# Patient Record
Sex: Male | Born: 1970 | Race: White | Hispanic: No | Marital: Married | Smoking: Never smoker
Health system: Southern US, Community
[De-identification: ages and names within clinical notes are randomized; demographics above are authoritative.]

## PROBLEM LIST (undated history)

## (undated) DIAGNOSIS — Z8719 Personal history of other diseases of the digestive system: Secondary | ICD-10-CM

## (undated) DIAGNOSIS — E291 Testicular hypofunction: Secondary | ICD-10-CM

## (undated) DIAGNOSIS — K219 Gastro-esophageal reflux disease without esophagitis: Secondary | ICD-10-CM

## (undated) DIAGNOSIS — I10 Essential (primary) hypertension: Secondary | ICD-10-CM

## (undated) DIAGNOSIS — E7800 Pure hypercholesterolemia, unspecified: Secondary | ICD-10-CM

## (undated) DIAGNOSIS — F419 Anxiety disorder, unspecified: Secondary | ICD-10-CM

## (undated) DIAGNOSIS — E785 Hyperlipidemia, unspecified: Secondary | ICD-10-CM

## (undated) DIAGNOSIS — J45909 Unspecified asthma, uncomplicated: Secondary | ICD-10-CM

## (undated) HISTORY — DX: Hyperlipidemia, unspecified: E78.5

## (undated) HISTORY — PX: APPENDECTOMY: SHX54

## (undated) HISTORY — DX: Anxiety disorder, unspecified: F41.9

## (undated) HISTORY — DX: Gastro-esophageal reflux disease without esophagitis: K21.9

---

## 1997-06-02 HISTORY — PX: LASIK: SHX215

## 2007-12-08 ENCOUNTER — Inpatient Hospital Stay (HOSPITAL_COMMUNITY): Admission: EM | Admit: 2007-12-08 | Discharge: 2007-12-09 | Payer: Self-pay | Admitting: Emergency Medicine

## 2009-09-04 ENCOUNTER — Ambulatory Visit: Payer: Self-pay | Admitting: Diagnostic Radiology

## 2009-09-04 ENCOUNTER — Emergency Department (HOSPITAL_BASED_OUTPATIENT_CLINIC_OR_DEPARTMENT_OTHER): Admission: EM | Admit: 2009-09-04 | Discharge: 2009-09-04 | Payer: Self-pay | Admitting: Emergency Medicine

## 2010-10-15 NOTE — Discharge Summary (Signed)
NAMEAIVAN, Joshua Phelps              ACCOUNT NO.:  [id number]   MEDICAL RECORD NO.:  [id number]          PATIENT TYPE:  INP   LOCATION:  2020                         FACILITY:  [facility]   PHYSICIAN:  [name], MD      DATE OF BIRTH:  [birth year]   DATE OF ADMISSION:  12/08/2007  DATE OF DISCHARGE:  12/09/2007                               DISCHARGE SUMMARY   DISCHARGE DIAGNOSES:  1. Chest pain, resolved.  2. Abnormal electrocardiogram.  3. Hypertension, treated.  4. Dyslipidemia.   HOSPITAL COURSE:  Joshua Phelps is an 40 year old male patient with no  known history of coronary artery disease who developed spontaneous  substernal chest pain, jaw pain, and headache lasting about 20 minutes  on Sunday.  The pain went away on it is own.  On the day of admission,  he had another episode of chest pain, jaw pain, and headache lasting  again about 20 minutes.  This occurred in a nonexertional setting.  He  went to see Dr. Abigail Miyamoto and he was given 4 baby aspirin, and his chest  pain resolved, but in the emergency room he felt worn out.  He did  have some nausea.   His EKG showed continual J-point elevation in V2 with nonspecific ST-T  wave changes in V3, V6.  There was significant inferior reciprocal  changes.   Total cholesterol 261, LDL 185, HDL 36, triglycerides 202, BUN 15,  creatinine 1.32, hemoglobin 16, hematocrit 47, cardiac isoenzymes  negative.   Because of his symptoms and because of his abnormal EKG, we feel that it  is important to go ahead and have a stress Cardiolite tomorrow in the  office.  This has been arranged.  He is to show up in the office at 10  a.m. for the test.  Otherwise, he is to remain on  low-sodium, heart-  healthy diet.  NPO after midnight.  Increase activity slowly.   DISCHARGE MEDICATIONS:  1. Plavix 75 mg a day.  2. Baby aspirin 81 a day.  3. Lisinopril 5 mg a day for hypertension.  4. Sublingual nitroglycerin p.r.n. chest pain.   Of note, his  cholesterol is elevated, and he has elected to try to  control this with diet, but may ultimately need to be on statin therapy.      Guy Franco, P.A.      [name], MD  Electronically Signed    LB/MEDQ  D:  12/09/2007  T:  12/10/2007  Job:  [id number]   cc:   [name], MD  Pam Drown, M.D.

## 2010-10-15 NOTE — H&P (Signed)
Joshua Phelps, Joshua Phelps              ACCOUNT NO.:  [id number]   MEDICAL RECORD NO.:  [id number]          PATIENT TYPE:  EMS   LOCATION:  [facility]                         FACILITY:  [facility]   PHYSICIAN:  [name], MD      DATE OF BIRTH:  09/29/1970   DATE OF ADMISSION:  12/08/2007  DATE OF DISCHARGE:                              HISTORY & PHYSICAL   CHIEF COMPLAINT:  Chest pain.   HISTORY:  Brendan Gadson is a 40 year old male patient with no known  history of coronary artery disease who developed spontaneous substernal  chest pain, jaw pain, and headache lasting about 20 minutes from Sunday.  The pain went away on its own.  Again, today while at work (  nonexertional), the patient began having chest pain, jaw pain, and then  a slight headache lasting about 20 minutes.  He then went to see Dr.  Abigail Miyamoto.  He was given 4 baby aspirin and the chest pain resolved, but  now he feels worn out.  He does have some nausea.  He was transported  by EMS to the [facility].   ALLERGIES:  No known drug allergies.   MEDICATIONS:  1. AndroGel.  2. Wellbutrin 300 mg a day.  3. Lisinopril 5 mg a day.  4. Pepcid over-the-counter.   SOCIAL HISTORY:  Drinks about 3 beers per day.  Over the weekend, he  says he drank a lot of beer, no tobacco or illicit drug use.   FAMILY HISTORY:  Dad had a myocardial infarction in his 51s.   PAST MEDICAL HISTORY:  1. Hypertension.  2. Anxiety.  3. GERD status post appendectomy.   ROS: unless specified above, all other ROS negative, no syncope, no  bleeding, no fevers.   PHYSICAL EXAMINATION:  VITAL SIGNS:  Blood pressure 150/90, pulse 72,  and respirations 16.  GEN: AAO x 3 in NAD  HEENT:  Grossly normal.  No carotid or subclavian bruits.  No JVD or  thyromegaly.  Sclerae clear.  Conjunctivae normal.  Nares without  drainage.  CHEST:  Clear to auscultation bilaterally.  No wheezing or rhonchi.  HEART:  Regular rate and rhythm.  No evidence of  murmur.  ABDOMEN:  Soft, nontender, and nondistended.  No masses.  No bruits.  EXTREMITIES:  No peripheral edema.  No femoral bruits.  NEURO:  Alert and oriented.  He was somewhat anxious appearing,  otherwise his cranial nerves are intact.  SKIN:  Warm and dry.   LABORATORY DATA:  Lab studies are currently pending that include CMET,  CBC, PT/INR, CK-MB, and troponin.  Chest x-ray has been done and final  results are pending.  His EKG shows normal sinus rhythm with J-point  elevation in leads V2 and V3 with T-wave inversion laterally.   ASSESSMENT AND PLAN:  1. Chest pain, atypical.  2. Family history of coronary artery disease.  3. Abnormal EKG.  4. Anxiety.  5. Hypertension.   The patient was currently asymptomatic.  We did note J-point elevation  in V2.  There were no reciprocal changes in the inferior leads.  At  this  point, we will treat the patient aggressively utilizing Lovenox,  nitrates, aspirin, and Plavix.  We will keep him n.p.o. after midnight  in the event that his biomarkers are positive, so that we can proceed  with cardiac catheterization.  Otherwise, if he remains pain-free and  his biomarkers markers are negative, we will tentatively schedule him  for a Cardiolite.  The patient has been seen and examined by Dr. Donato Schultz.      Guy Franco, P.A.      [name], MD  Electronically Signed    LB/MEDQ  D:  12/08/2007  T:  12/09/2007  Job:  [id number]   cc:   Pam Drown, M.D.  [name], MD

## 2011-02-27 LAB — COMPREHENSIVE METABOLIC PANEL
AST: 22
Alkaline Phosphatase: 61
CO2: 30
Calcium: 9.5
GFR calc Af Amer: >60
GFR calc non Af Amer: >60
Glucose, Bld: 129 — ABNORMAL HIGH
Total Bilirubin: 1.2
Total Protein: 7.4

## 2011-02-27 LAB — CARDIAC PANEL(CRET KIN+CKTOT+MB+TROPI)
CK, MB: 2.3
CK, MB: 3.7
Relative Index: 1.5
Total CK: 202
Troponin I: 0.01

## 2011-02-27 LAB — CK TOTAL AND CKMB (NOT AT ARMC)
CK, MB: 2.6
Relative Index: 1.7
Total CK: 153

## 2011-02-27 LAB — LIPID PANEL
LDL Cholesterol: 185 — ABNORMAL HIGH
Triglycerides: 202 — ABNORMAL HIGH

## 2011-02-27 LAB — CBC
Hemoglobin: 17.1 — ABNORMAL HIGH
Platelets: 242
Platelets: 244
RBC: 5.34
RDW: 12.6

## 2011-02-27 LAB — PROTIME-INR: Prothrombin Time: 13

## 2011-02-27 LAB — TSH: TSH: 2.183 (ref 0.350–4.500)

## 2015-12-03 ENCOUNTER — Emergency Department (HOSPITAL_COMMUNITY): Payer: Commercial Managed Care - PPO

## 2015-12-03 ENCOUNTER — Emergency Department (HOSPITAL_COMMUNITY)
Admission: EM | Admit: 2015-12-03 | Discharge: 2015-12-03 | Disposition: A | Discharge status: Home / Self Care | Payer: Commercial Managed Care - PPO | Attending: Emergency Medicine | Admitting: Emergency Medicine

## 2015-12-03 ENCOUNTER — Encounter (HOSPITAL_COMMUNITY): Payer: Self-pay | Admitting: Emergency Medicine

## 2015-12-03 ENCOUNTER — Ambulatory Visit (HOSPITAL_COMMUNITY)
Admission: EM | Admit: 2015-12-03 | Discharge: 2015-12-03 | Disposition: A | Discharge status: Nursing Home (Includes ICF) | Payer: Commercial Managed Care - PPO | Source: Home / Self Care | Attending: Family Medicine | Admitting: Family Medicine

## 2015-12-03 DIAGNOSIS — Z7982 Long term (current) use of aspirin: Secondary | ICD-10-CM | POA: Insufficient documentation

## 2015-12-03 DIAGNOSIS — F419 Anxiety disorder, unspecified: Secondary | ICD-10-CM | POA: Diagnosis not present

## 2015-12-03 DIAGNOSIS — R110 Nausea: Secondary | ICD-10-CM

## 2015-12-03 DIAGNOSIS — R002 Palpitations: Secondary | ICD-10-CM | POA: Diagnosis not present

## 2015-12-03 DIAGNOSIS — Z79899 Other long term (current) drug therapy: Secondary | ICD-10-CM | POA: Diagnosis not present

## 2015-12-03 DIAGNOSIS — R9431 Abnormal electrocardiogram [ECG] [EKG]: Secondary | ICD-10-CM | POA: Diagnosis not present

## 2015-12-03 DIAGNOSIS — I10 Essential (primary) hypertension: Secondary | ICD-10-CM | POA: Diagnosis not present

## 2015-12-03 DIAGNOSIS — F418 Other specified anxiety disorders: Secondary | ICD-10-CM | POA: Diagnosis not present

## 2015-12-03 DIAGNOSIS — R0789 Other chest pain: Secondary | ICD-10-CM | POA: Diagnosis not present

## 2015-12-03 DIAGNOSIS — J45909 Unspecified asthma, uncomplicated: Secondary | ICD-10-CM | POA: Insufficient documentation

## 2015-12-03 HISTORY — DX: Testicular hypofunction: E29.1

## 2015-12-03 HISTORY — DX: Essential (primary) hypertension: I10

## 2015-12-03 HISTORY — DX: Unspecified asthma, uncomplicated: J45.909

## 2015-12-03 HISTORY — DX: Pure hypercholesterolemia, unspecified: E78.00

## 2015-12-03 LAB — CBC WITH DIFFERENTIAL/PLATELET
BASOS ABS: 0 10*3/uL (ref 0.0–0.1)
BASOS PCT: 0 %
EOS ABS: 0 10*3/uL (ref 0.0–0.7)
Eosinophils Relative: 0 %
HCT: 47.4 % (ref 39.0–52.0)
Hemoglobin: 15.9 g/dL (ref 13.0–17.0)
Lymphocytes Relative: 13 %
Lymphs Abs: 0.9 10*3/uL (ref 0.7–4.0)
MCH: 27.4 pg (ref 26.0–34.0)
MCHC: 33.5 g/dL (ref 30.0–36.0)
MCV: 81.7 fL (ref 78.0–100.0)
MONO ABS: 0.5 10*3/uL (ref 0.1–1.0)
MONOS PCT: 6 %
NEUTROS ABS: 5.9 10*3/uL (ref 1.7–7.7)
Neutrophils Relative %: 81 %
Platelets: 217 10*3/uL (ref 150–400)
RBC: 5.8 MIL/uL (ref 4.22–5.81)
RDW: 14.3 % (ref 11.5–15.5)
WBC: 7.3 10*3/uL (ref 4.0–10.5)

## 2015-12-03 LAB — I-STAT TROPONIN, ED: TROPONIN I, POC: 0.01 ng/mL (ref 0.00–0.08)

## 2015-12-03 LAB — I-STAT CHEM 8, ED
BUN: 13 mg/dL (ref 6–20)
CALCIUM ION: 1.09 mmol/L — AB (ref 1.13–1.30)
CREATININE: 1.2 mg/dL (ref 0.61–1.24)
Chloride: 99 mmol/L — ABNORMAL LOW (ref 101–111)
Glucose, Bld: 102 mg/dL — ABNORMAL HIGH (ref 65–99)
HEMATOCRIT: 49 % (ref 39.0–52.0)
HEMOGLOBIN: 16.7 g/dL (ref 13.0–17.0)
Potassium: 4.1 mmol/L (ref 3.5–5.1)
SODIUM: 138 mmol/L (ref 135–145)
TCO2: 26 mmol/L (ref 0–100)

## 2015-12-03 MED ORDER — ASPIRIN 81 MG PO CHEW
CHEWABLE_TABLET | ORAL | Status: AC
  Filled 2015-12-03: qty 4

## 2015-12-03 MED ORDER — ONDANSETRON 4 MG PO TBDP
ORAL_TABLET | ORAL | Status: AC
  Filled 2015-12-03: qty 1

## 2015-12-03 MED ORDER — ONDANSETRON 4 MG PO TBDP
4.0000 mg | ORAL_TABLET | Freq: Once | ORAL | Status: AC
  Administered 2015-12-03: 4 mg via ORAL

## 2015-12-03 MED ORDER — SODIUM CHLORIDE 0.9 % IV SOLN
Freq: Once | INTRAVENOUS | Status: AC
  Administered 2015-12-03: 13:00:00 via INTRAVENOUS

## 2015-12-03 MED ORDER — LORAZEPAM 2 MG/ML IJ SOLN
INTRAMUSCULAR | Status: AC
  Filled 2015-12-03: qty 1

## 2015-12-03 MED ORDER — ASPIRIN 81 MG PO CHEW
324.0000 mg | CHEWABLE_TABLET | Freq: Once | ORAL | Status: AC
  Administered 2015-12-03: 324 mg via ORAL

## 2015-12-03 MED ORDER — NITROGLYCERIN 0.4 MG SL SUBL
SUBLINGUAL_TABLET | SUBLINGUAL | Status: AC
  Filled 2015-12-03: qty 1

## 2015-12-03 MED ORDER — NITROGLYCERIN 0.4 MG SL SUBL
0.4000 mg | SUBLINGUAL_TABLET | SUBLINGUAL | Status: DC | PRN
  Administered 2015-12-03: 0.4 mg via SUBLINGUAL

## 2015-12-03 MED ORDER — LORAZEPAM 2 MG/ML IJ SOLN
1.0000 mg | Freq: Once | INTRAMUSCULAR | Status: AC
  Administered 2015-12-03: 1 mg via INTRAVENOUS

## 2015-12-03 NOTE — ED Notes (Signed)
Notified carelink 

## 2015-12-03 NOTE — Discharge Instructions (Signed)

## 2015-12-03 NOTE — ED Provider Notes (Signed)
CSN: [id number]     Arrival date & time 12/03/15  1030 History   First MD Initiated Contact with Patient 12/03/15 1137     No chief complaint on file.  (Consider location/radiation/quality/duration/timing/severity/associated sxs/prior Treatment) HPI Comments: 45 year old male presents to the urgent care after 3 days of partying, consuming more alcohol than usual, playing ice hockey and increasing his physical activity. He states he is generally deconditioned and sedentary. Last evening he was playing hockey and he felt palpitations or heart heartbeats in the middle of his chest. Shortly after he had to sit down and felt weak and just not right and needed to leave the game. He never actually had chest pain according to him. When he was removing his hockey close and having to leave the game he states he got really depressed and anxious. Today he is complaining of mild tightness across the anterior chest and minor nausea. His also complaining of an increase level of anxiety and sadness. Denies vomiting. Denies diaphoresis.  Past medical history includes dyslipidemia, hypertension, obesity, sedentary lifestyle, asthma and positive family history for early heart disease.   Past Medical History  Diagnosis Date  . Hypertension   . High cholesterol   . Asthma   . Hypogonadism male    History reviewed. No pertinent past surgical history. Family History  Problem Relation Age of Onset  . Cancer Mother   . Cancer Father   . Heart attack Father    Social History  Substance Use Topics  . Smoking status: Never Smoker   . Smokeless tobacco: None  . Alcohol Use: Yes    Review of Systems  Constitutional: Positive for activity change and fatigue. Negative for fever.  HENT: Negative.   Respiratory: Positive for chest tightness. Negative for cough and shortness of breath.   Cardiovascular: Positive for chest pain and palpitations. Negative for leg swelling.  Gastrointestinal: Positive for nausea.  Negative for vomiting and abdominal pain.  Genitourinary: Negative.   Musculoskeletal: Negative.   Skin: Negative for rash.  Neurological: Negative.  Negative for seizures, syncope, facial asymmetry and numbness.  Psychiatric/Behavioral: The patient is nervous/anxious.   All other systems reviewed and are negative.   Allergies  Review of patient's allergies indicates no known allergies.  Home Medications   Prior to Admission medications   Medication Sig Start Date End Date Taking? Authorizing Provider  LISINOPRIL-HYDROCHLOROTHIAZIDE PO Take by mouth.   Yes Historical Provider, MD  OMEPRAZOLE PO Take by mouth.   Yes Historical Provider, MD  SIMVASTATIN PO Take by mouth.   Yes Historical Provider, MD  Testosterone 75 MG PLLT by Implant route.   Yes Historical Provider, MD   Meds Ordered and Administered this Visit   Medications  0.9 %  sodium chloride infusion (not administered)  nitroGLYCERIN (NITROSTAT) SL tablet 0.4 mg (not administered)  aspirin chewable tablet 324 mg (not administered)  LORazepam (ATIVAN) injection 1 mg (not administered)    BP 132/84 mmHg  Pulse 83  Temp(Src) 97.7 F (36.5 C) (Oral)  Resp 16  SpO2 96% No data found.   Physical Exam  Constitutional: He is oriented to person, place, and time. He appears well-developed and well-nourished. No distress.  Eyes: EOM are normal.  Neck: Normal range of motion. Neck supple.  Cardiovascular: Normal rate, regular rhythm, normal heart sounds and intact distal pulses.   Pulmonary/Chest: Effort normal and breath sounds normal. No respiratory distress. He has no wheezes. He has no rales.  Abdominal: Soft. There is no tenderness.  Musculoskeletal: He exhibits no edema.  Neurological: He is alert and oriented to person, place, and time. No cranial nerve deficit. He exhibits normal muscle tone. Coordination normal.  Skin: Skin is warm and dry.  Psychiatric: His speech is normal. Judgment and thought content normal.  His mood appears anxious. He is not aggressive. Cognition and memory are normal. He exhibits a depressed mood.  Nursing note and vitals reviewed.   ED Course  Procedures (including critical care time)  Labs Review Labs Reviewed - No data to display  Imaging Review No results found.  ED ECG REPORT   Date: 12/03/2015  Rate: 78  Rhythm: normal sinus rhythm  QRS Axis: left  Intervals: normal  ST/T Wave abnormalities: T wave inversions in lead 1, aVL, V4, V5 and V6.  Conduction Disutrbances:none  Narrative Interpretation:   Old EKG Reviewed: none available  I have personally reviewed the EKG tracing and agree with the computerized printout as noted.  Visual Acuity Review  Right Eye Distance:   Left Eye Distance:   Bilateral Distance:    Right Eye Near:   Left Eye Near:    Bilateral Near:         MDM   1. Other chest pain   2. Abnormal EKG   3. Nausea   4. Anxiety about health    Transfer to [place] via care Link for chest pain and abnormal EKG. Meds ordered this encounter  Medications  . LISINOPRIL-HYDROCHLOROTHIAZIDE PO    Sig: Take by mouth.  . Testosterone 75 MG PLLT    Sig: by Implant route.  Marland Kitchen. SIMVASTATIN PO    Sig: Take by mouth.  . OMEPRAZOLE PO    Sig: Take by mouth.  Marland Kitchen. 0.9 %  sodium chloride infusion    Sig:   . nitroGLYCERIN (NITROSTAT) SL tablet 0.4 mg    Sig:   . aspirin chewable tablet 324 mg    Sig:   . LORazepam (ATIVAN) injection 1 mg    Sig:    IV NS, EKG monitor, ASA, NTG, Ativan 1 mg IV, Zofran 4 mg po.     Hayden Rasmussenavid Hyland Mollenkopf, NP 12/03/15 1321

## 2015-12-03 NOTE — ED Notes (Signed)
Iv site per patient request

## 2015-12-03 NOTE — ED Notes (Signed)
PT st's he feels much better at this time.  Pt ambulatory to bathroom without any problems

## 2015-12-03 NOTE — ED Notes (Signed)
Playing ice hockey last night.  Felt like throbbing in chest, just not feeling well.  Went home, tried to hydrate self with liquids.  Patient overall felt very anxious.  Hard time going to sleep and hard time staying asleep.  Felt like arm numb intermittently.  Today feels very emotional, very anxious about what he is feeling.  Also reports feeling very sad.  This overwhelming feeling of sadness and fear.  Also has felt nausea.  Reports twinges in chest as well as other areas of body.  Last 3 days has been drinking alcohol, poor eating , and sleeping.   Has had a similar episode after going "way overboard with partying"  Patient went to local ([place] to be told he was ok/normal.

## 2015-12-03 NOTE — ED Notes (Signed)
Pt states last night while playing hockey he started feeling like he could hear his "heart beeping in his head and his chest." Pt denies any chest pain, states he just had a "funny feeling". Pt also reports feeling anxious and fatigued, Pt reports increased in alcohol intake and decrease in sleep the last few days. Pt holding bible in hand and appears anxious.

## 2015-12-03 NOTE — ED Notes (Signed)
MD at bedside. pickering 

## 2015-12-03 NOTE — ED Notes (Signed)
carelink at bedside 

## 2015-12-03 NOTE — ED Provider Notes (Signed)
CSN: [id number]     Arrival date & time 12/03/15  1350 History   First MD Initiated Contact with Patient 12/03/15 1353     Chief Complaint  Patient presents with  . Palpitations  . Anxiety      Patient is a 45 y.o. male presenting with palpitations and anxiety. The history is provided by the patient.  Palpitations Palpitations quality:  Regular Associated symptoms: no back pain, no chest pain, no nausea, no numbness, no shortness of breath, no vomiting and no weakness   Anxiety Pertinent negatives include no chest pain, no abdominal pain, no headaches and no shortness of breath.  Patient presents after palpitations and a feeling of anxiety and sadness. States he had a rough few days with partying and drinking alcohol. States he did more than he normally would. States he was playing hockey yesterday and began to feel his heart beating. States wasn't particular going fast or slow but he was more aware of it as pounding in his chest has had. States she had to get out of the game. States he been feeling well for that been playing well. States he had had some fatigue shortly before that. No chest pain. No trouble breathing. States he was very sad after it. States he went home that night and was also sad. States this is unusual for him. States he is emotional but he is usually not sad. No weight loss. No drug use. No history of coronary artery disease but his father did have some coronary disease. States he had somewhat similar episodes around 8 years ago and had a stress test that was negative at that time. Seen at urgent care and then sent here for an abnormal EKG.  Past Medical History  Diagnosis Date  . Hypertension   . High cholesterol   . Asthma   . Hypogonadism male    History reviewed. No pertinent past surgical history. Family History  Problem Relation Age of Onset  . Cancer Mother   . Cancer Father   . Heart attack Father    Social History  Substance Use Topics  . Smoking status:  Never Smoker   . Smokeless tobacco: None  . Alcohol Use: Yes    Review of Systems  Constitutional: Positive for fatigue. Negative for activity change.  Eyes: Negative for pain.  Respiratory: Negative for chest tightness and shortness of breath.   Cardiovascular: Positive for palpitations. Negative for chest pain and leg swelling.  Gastrointestinal: Negative for nausea, vomiting, abdominal pain and diarrhea.  Genitourinary: Negative for flank pain.  Musculoskeletal: Negative for back pain and neck stiffness.  Skin: Negative for rash.  Neurological: Negative for weakness, numbness and headaches.  Psychiatric/Behavioral: Negative for suicidal ideas and behavioral problems.      Allergies  Review of patient's allergies indicates no known allergies.  Home Medications   Prior to Admission medications   Medication Sig Start Date End Date Taking? Authorizing Provider  albuterol (PROVENTIL HFA;VENTOLIN HFA) 108 (90 Base) MCG/ACT inhaler Inhale 1-2 puffs into the lungs every 6 (six) hours as needed for wheezing or shortness of breath.   Yes Historical Provider, MD  ASMANEX 30 METERED DOSES 220 MCG/INH inhaler Inhale 1 puff into the lungs every evening. 11/12/15  Yes Historical Provider, MD  aspirin EC 81 MG tablet Take 81 mg by mouth daily.   Yes Historical Provider, MD  ibuprofen (ADVIL,MOTRIN) 200 MG tablet Take 600 mg by mouth every 6 (six) hours as needed (pain).   Yes Historical  Provider, MD  lisinopril-hydrochlorothiazide (PRINZIDE,ZESTORETIC) 20-12.5 MG tablet Take 1 tablet by mouth daily. 11/09/15  Yes Historical Provider, MD  montelukast (SINGULAIR) 10 MG tablet Take 10 mg by mouth every evening. 11/09/15  Yes Historical Provider, MD  Multiple Vitamins-Minerals (MULTIVITAMIN ADULT PO) Take 1 tablet by mouth daily.   Yes Historical Provider, MD  omeprazole (PRILOSEC) 40 MG capsule Take 40 mg by mouth daily. 11/09/15  Yes Historical Provider, MD  simvastatin (ZOCOR) 20 MG tablet Take 20 mg  by mouth daily. 11/09/15  Yes Historical Provider, MD  tadalafil (CIALIS) 5 MG tablet Take 5 mg by mouth daily.   Yes Historical Provider, MD  testosterone cypionate (DEPOTESTOSTERONE CYPIONATE) 200 MG/ML injection Inject 1 mL into the muscle every 7 (seven) days. 09/05/15  Yes Historical Provider, MD   BP 121/72 mmHg  Pulse 77  Temp(Src) 98 F (36.7 C) (Oral)  Resp 16  SpO2 96% Physical Exam  Constitutional: He is oriented to person, place, and time. He appears well-developed and well-nourished.  HENT:  Head: Normocephalic and atraumatic.  Eyes: Pupils are equal, round, and reactive to light.  Neck: Normal range of motion. Neck supple.  Cardiovascular: Normal rate, regular rhythm and normal heart sounds.   No murmur heard. Pulmonary/Chest: Effort normal and breath sounds normal.  Abdominal: Soft. Bowel sounds are normal. He exhibits no distension. There is no tenderness.  Musculoskeletal: Normal range of motion. He exhibits no edema.  Neurological: He is alert and oriented to person, place, and time. No cranial nerve deficit.  Skin: Skin is warm and dry.  Psychiatric: He has a normal mood and affect.  Nursing note and vitals reviewed.   ED Course  Procedures (including critical care time) Labs Review Labs Reviewed  I-STAT CHEM 8, ED - Abnormal; Notable for the following:    Chloride 99 (*)    Glucose, Bld 102 (*)    Calcium, Ion 1.09 (*)    All other components within normal limits  CBC WITH DIFFERENTIAL/PLATELET  I-STAT TROPOININ, ED    Imaging Review Dg Chest 2 View  12/03/2015  CLINICAL DATA:  Palpitations last night while playing hockey, funny feeling, anxious, fatigue, increased alcohol intake and decreased sleepi in last few days, hypertension, asthma EXAM: CHEST  2 VIEW COMPARISON:  12/08/2007 FINDINGS: Upper normal heart size. Mediastinal contours and pulmonary vascularity normal. Lungs clear. No pleural effusion or pneumothorax. Fusion of LEFT first and second ribs.  IMPRESSION: No acute abnormalities. Electronically Signed   By: Ulyses Southward M.D.   On: 12/03/2015 15:00   I have personally reviewed and evaluated these images and lab results as part of my medical decision-making.   EKG Interpretation   Date/Time:  Monday December 03 2015 14:17:55 EDT Ventricular Rate:  79 PR Interval:    QRS Duration: 104 QT Interval:  363 QTC Calculation: 417 R Axis:   -15 Text Interpretation:  Sinus rhythm Consider left atrial enlargement  Borderline left axis deviation Probable anteroseptal infarct, old  Nonspecific T abnormalities, lateral leads Baseline wander in lead(s) V1  Confirmed by Rubin Payor  MD, Harrold Donath [phone]) on 12/03/2015 2:32:49 PM      MDM   Final diagnoses:  Palpitations    Patient presents with palpitations. Had worse episode while he was playing hockey yesterday. Lab work reassuring now. Slight hypokalemia. Had been drinking a lot recently and could have a component of holiday heart. Feels better. Will discharge home. Will have follow-up with cardiology as needed. Discharge home.  Patient's girlfriend was wearing  a [employer] shirt    Benjiman CoreNathan Shanicqua Coldren, MD 12/03/15 1600

## 2016-07-08 ENCOUNTER — Encounter (HOSPITAL_COMMUNITY): Payer: Self-pay | Admitting: Emergency Medicine

## 2016-07-08 ENCOUNTER — Ambulatory Visit (HOSPITAL_COMMUNITY)
Admission: EM | Admit: 2016-07-08 | Discharge: 2016-07-08 | Disposition: A | Discharge status: Home / Self Care | Payer: Commercial Managed Care - PPO | Attending: Family Medicine | Admitting: Family Medicine

## 2016-07-08 DIAGNOSIS — S0081XA Abrasion of other part of head, initial encounter: Secondary | ICD-10-CM | POA: Diagnosis not present

## 2016-07-08 DIAGNOSIS — S0083XA Contusion of other part of head, initial encounter: Secondary | ICD-10-CM | POA: Diagnosis not present

## 2016-07-08 MED ORDER — BACITRACIN ZINC 500 UNIT/GM EX OINT
TOPICAL_OINTMENT | CUTANEOUS | Status: AC
  Filled 2016-07-08: qty 0.9

## 2016-07-08 NOTE — ED Triage Notes (Signed)
[facility] with a complaint of a laceration to his left eye lid that occurred last night when he got hit with a hockey puck. The patient was up to date on his TDAP.

## 2016-07-08 NOTE — ED Provider Notes (Signed)
CSN: [id number]     Arrival date & time 07/08/16  1848 History   None    Chief Complaint  Patient presents with  . Facial Laceration   (Consider location/radiation/quality/duration/timing/severity/associated sxs/prior Treatment) Patient c/o laceration to left eye.   The history is provided by the patient.  Laceration  Location:  Face Facial laceration location:  L eye Depth:  Cutaneous Time since incident:  1 day Laceration mechanism:  Blunt object Pain details:    Quality:  Aching   Severity:  Mild   Timing:  Constant Foreign body present:  No foreign bodies Relieved by:  Nothing Worsened by:  Nothing Ineffective treatments:  None tried Tetanus status:  Up to date   Past Medical History:  Diagnosis Date  . Asthma   . High cholesterol   . Hypertension   . Hypogonadism male    History reviewed. No pertinent surgical history. Family History  Problem Relation Age of Onset  . Cancer Mother   . Cancer Father   . Heart attack Father    Social History  Substance Use Topics  . Smoking status: Never Smoker  . Smokeless tobacco: Not on file  . Alcohol use Yes    Review of Systems  Constitutional: Negative.   HENT: Negative.   Eyes: Negative.   Respiratory: Negative.   Cardiovascular: Negative.   Gastrointestinal: Negative.   Endocrine: Negative.   Genitourinary: Negative.   Musculoskeletal: Negative.   Skin: Positive for wound.  Allergic/Immunologic: Negative.   Neurological: Negative.   Hematological: Negative.   Psychiatric/Behavioral: Negative.     Allergies  Patient has no known allergies.  Home Medications   Prior to Admission medications   Medication Sig Start Date End Date Taking? Authorizing Provider  albuterol (PROVENTIL HFA;VENTOLIN HFA) 108 (90 Base) MCG/ACT inhaler Inhale 1-2 puffs into the lungs every 6 (six) hours as needed for wheezing or shortness of breath.    Historical Provider, MD  ASMANEX 30 METERED DOSES 220 MCG/INH inhaler Inhale  1 puff into the lungs every evening. 11/12/15   Historical Provider, MD  aspirin EC 81 MG tablet Take 81 mg by mouth daily.    Historical Provider, MD  ibuprofen (ADVIL,MOTRIN) 200 MG tablet Take 600 mg by mouth every 6 (six) hours as needed (pain).    Historical Provider, MD  lisinopril-hydrochlorothiazide (PRINZIDE,ZESTORETIC) 20-12.5 MG tablet Take 1 tablet by mouth daily. 11/09/15   Historical Provider, MD  montelukast (SINGULAIR) 10 MG tablet Take 10 mg by mouth every evening. 11/09/15   Historical Provider, MD  Multiple Vitamins-Minerals (MULTIVITAMIN ADULT PO) Take 1 tablet by mouth daily.    Historical Provider, MD  omeprazole (PRILOSEC) 40 MG capsule Take 40 mg by mouth daily. 11/09/15   Historical Provider, MD  simvastatin (ZOCOR) 20 MG tablet Take 20 mg by mouth daily. 11/09/15   Historical Provider, MD  tadalafil (CIALIS) 5 MG tablet Take 5 mg by mouth daily.    Historical Provider, MD  testosterone cypionate (DEPOTESTOSTERONE CYPIONATE) 200 MG/ML injection Inject 1 mL into the muscle every 7 (seven) days. 09/05/15   Historical Provider, MD   Meds Ordered and Administered this Visit  Medications - No data to display  BP 149/98 (BP Location: Right Arm)   Pulse 72   Temp 98 F (36.7 C) (Oral)   Resp 18   SpO2 97%  No data found.   Physical Exam  Constitutional: He appears well-developed and well-nourished.  HENT:  Head: Normocephalic.  Right Ear: External ear normal.  Left  Ear: External ear normal.  Mouth/Throat: Oropharynx is clear and moist.  Eyes: Conjunctivae and EOM are normal. Pupils are equal, round, and reactive to light.  Left upper eye lid with swelling and bruising.  Left inner corner with abrasion.  Neck: Normal range of motion. Neck supple.  Cardiovascular: Normal rate, regular rhythm and normal heart sounds.   Pulmonary/Chest: Effort normal and breath sounds normal.  Abdominal: Soft. Bowel sounds are normal.  Skin:  Abrasion corner of left eye.  No laceration   Nursing note and vitals reviewed.   Urgent Care Course     Procedures (including critical care time)  Labs Review Labs Reviewed - No data to display  Imaging Review No results found.   Visual Acuity Review  Right Eye Distance:   Left Eye Distance:   Bilateral Distance:    Right Eye Near:   Left Eye Near:    Bilateral Near:         MDM   1. Contusion of face, initial encounter   2. Abrasion of face, initial encounter    Cleaned the corner of his eyes and he has abrasion. Cleaned with betadine and saline and bacitracin ointment applied. Apply bacitracin ointment qd      Deatra Canter, FNP 07/08/16 2009

## 2016-09-10 DIAGNOSIS — I10 Essential (primary) hypertension: Secondary | ICD-10-CM | POA: Diagnosis not present

## 2016-09-10 DIAGNOSIS — R748 Abnormal levels of other serum enzymes: Secondary | ICD-10-CM | POA: Diagnosis not present

## 2016-09-10 DIAGNOSIS — E782 Mixed hyperlipidemia: Secondary | ICD-10-CM | POA: Diagnosis not present

## 2016-09-10 DIAGNOSIS — J4530 Mild persistent asthma, uncomplicated: Secondary | ICD-10-CM | POA: Diagnosis not present

## 2016-09-10 DIAGNOSIS — J309 Allergic rhinitis, unspecified: Secondary | ICD-10-CM | POA: Diagnosis not present

## 2017-03-05 DIAGNOSIS — Z23 Encounter for immunization: Secondary | ICD-10-CM | POA: Diagnosis not present

## 2017-03-05 DIAGNOSIS — E782 Mixed hyperlipidemia: Secondary | ICD-10-CM | POA: Diagnosis not present

## 2017-03-05 DIAGNOSIS — R0789 Other chest pain: Secondary | ICD-10-CM | POA: Diagnosis not present

## 2017-03-05 DIAGNOSIS — I10 Essential (primary) hypertension: Secondary | ICD-10-CM | POA: Diagnosis not present

## 2017-03-06 ENCOUNTER — Telehealth: Payer: Self-pay

## 2017-03-06 NOTE — Telephone Encounter (Signed)
SENT NOTES TO SCHEDULING 

## 2017-04-13 ENCOUNTER — Encounter: Payer: Self-pay | Admitting: Cardiology

## 2017-04-15 DIAGNOSIS — R972 Elevated prostate specific antigen [PSA]: Secondary | ICD-10-CM | POA: Diagnosis not present

## 2017-04-15 DIAGNOSIS — E291 Testicular hypofunction: Secondary | ICD-10-CM | POA: Diagnosis not present

## 2017-04-15 DIAGNOSIS — R311 Benign essential microscopic hematuria: Secondary | ICD-10-CM | POA: Diagnosis not present

## 2017-04-28 ENCOUNTER — Other Ambulatory Visit: Payer: Self-pay | Admitting: Cardiology

## 2017-04-28 ENCOUNTER — Encounter (INDEPENDENT_AMBULATORY_CARE_PROVIDER_SITE_OTHER): Payer: Self-pay

## 2017-04-28 ENCOUNTER — Ambulatory Visit: Payer: Commercial Managed Care - PPO | Admitting: Cardiology

## 2017-04-28 ENCOUNTER — Encounter: Payer: Self-pay | Admitting: Cardiology

## 2017-04-28 VITALS — BP 146/100 | HR 87 | Ht 73.0 in | Wt 241.8 lb

## 2017-04-28 DIAGNOSIS — E7800 Pure hypercholesterolemia, unspecified: Secondary | ICD-10-CM | POA: Diagnosis not present

## 2017-04-28 DIAGNOSIS — R002 Palpitations: Secondary | ICD-10-CM | POA: Diagnosis not present

## 2017-04-28 DIAGNOSIS — R0789 Other chest pain: Secondary | ICD-10-CM

## 2017-04-28 MED ORDER — METOPROLOL TARTRATE 50 MG PO TABS: 50.0000 mg | ORAL_TABLET | Freq: Once | ORAL | 0 refills | Status: DC

## 2017-04-28 NOTE — Progress Notes (Signed)
Cardiology Office Note:    Date:  04/28/2017   ID:  Yolande JollyMichael J Bebo, DOB 28-Mar-1971, MRN [id number]  PCP:  Gweneth DimitriMcNeill, Wendy, MD  Cardiologist:  Donato SchultzMark Skains, MD    Referring MD: Gweneth DimitriMcNeill, Wendy, MD     History of Present Illness:    Yolande JollyMichael J Leverich is a 46 y.o. male here for the evaluation of chest pain and palpitations at the request of Dr. Selena BattenWendy McNeil.  He had an episode where he visited the emergency department on 12/2015 with palpitations after playing hockey, more pounding.  No prior history of coronary disease.  Father does have a diagnosis of CAD.  In 2009 he underwent a stress test for similar episode that was normal.  In review of 03/05/17 office visit by Dr. Uvaldo RisingMcNeil he had been complaining of intermittent chest pain episodes that occasionally may last all day long but at some point may have lasted 10 minutes.  They occur randomly described as a dull achiness but beneath left breast it seems to subside somewhat if he opens his chest up/pulls his shoulders back.  No change with twisting.  No shortness of breath.  His blood pressure in one point was 170/110 which was quite high, he was anxious, stressed at work.  He may have been more short of breath when playing hockey.  He has had a history of 3 prior emergency department visits for chest pain and palpitations in the last 8 years.  All negative evaluations.  No clear explanation of why he was having this pain.  Pain roams, left right, bottom.  Seems fairly atypical.  He is still quite worried about this.  His father had myocardial infarction age 47, his uncle also at 7663 but they did not take good care of themselves.  Past Medical History:  Diagnosis Date  . Abnormal nuclear stress test 12/2007   NO ISCHEMIA, LOW RISK, NORMAL EJECTION FRACTION, 15 METS (PSEUDONORMALIZATION OF T WAVES DURING STRESS)  . Anxiety   . Asthma   . Dyspepsia and disorder of function of stomach   . Esophageal reflux   . High cholesterol   . Hyperlipidemia    . Hypertension   . Hypogonadism male     History reviewed. No pertinent surgical history.  Current Medications: Current Meds  Medication Sig  . albuterol (PROVENTIL HFA;VENTOLIN HFA) 108 (90 Base) MCG/ACT inhaler Inhale 1-2 puffs into the lungs every 6 (six) hours as needed for wheezing or shortness of breath.  . ASMANEX 30 METERED DOSES 220 MCG/INH inhaler Inhale 1 puff into the lungs every evening.  Marland Kitchen. aspirin EC 81 MG tablet Take 81 mg by mouth daily.  Marland Kitchen. ibuprofen (ADVIL,MOTRIN) 200 MG tablet Take 600 mg by mouth every 6 (six) hours as needed (pain).  Marland Kitchen. lisinopril-hydrochlorothiazide (PRINZIDE,ZESTORETIC) 20-12.5 MG tablet Take 1 tablet by mouth daily.  . montelukast (SINGULAIR) 10 MG tablet Take 10 mg by mouth every evening.  . Multiple Vitamins-Minerals (MULTIVITAMIN ADULT PO) Take 1 tablet by mouth daily.  Marland Kitchen. omeprazole (PRILOSEC) 40 MG capsule Take 40 mg by mouth daily.  . rosuvastatin (CRESTOR) 10 MG tablet Take 10 mg by mouth daily.  . tadalafil (CIALIS) 5 MG tablet Take 5 mg by mouth daily.  Marland Kitchen. testosterone cypionate (DEPOTESTOSTERONE CYPIONATE) 200 MG/ML injection Inject 1 mL into the muscle every 7 (seven) days.  . [DISCONTINUED] simvastatin (ZOCOR) 20 MG tablet Take 20 mg by mouth daily.     Allergies:   Patient has no known allergies.   Social History  Socioeconomic History  . Marital status: Single    Spouse name: None  . Number of children: None  . Years of education: None  . Highest education level: None  Social Needs  . Financial resource strain: None  . Food insecurity - worry: None  . Food insecurity - inability: None  . Transportation needs - medical: None  . Transportation needs - non-medical: None  Occupational History  . 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  Tobacco Use  . Smoking status: Never Smoker  . Smokeless tobacco: Never Used  Substance and Sexual Activity  . Alcohol use: Yes  . Drug use: No  . Sexual activity: None  Other Topics Concern  . None    Social History Narrative  . None     Family History: The patient's family history includes Anxiety disorder (age of onset: 2442) in his sister; Cancer (age of onset: 7060) in his father; Cancer (age of onset: 6571) in his mother; Clotting disorder in his father; Emphysema in his father; Heart attack in his father; Hypertension in his father and mother. ROS:   Please see the history of present illness.   No syncope bleeding orthopnea PND.  All other systems reviewed and are negative.  EKGs/Labs/Other Studies Reviewed:    The following studies were reviewed today: Prior office notes, EKG, lab work reviewed.  EKG:  EKG is  ordered today.  The ekg ordered today demonstrates sinus rhythm left anterior fascicular block, nonspecific ST-T wave changes, poor R wave progression, T wave inversion noted in V5 and V6 personally viewed.  Recent Labs: No results found for requested labs within last 8760 hours.  Recent Lipid Panel    Component Value Date/Time   CHOL (H) 12/08/2007 2040    261        ATP III CLASSIFICATION:  <200     mg/dL   Desirable  [id number]  mg/dL   Borderline High  >=045>=240    mg/dL   High   TRIG 409202 (H) 81/19/147807/01/2008 2040   HDL 36 (L) 12/08/2007 2040   CHOLHDL 7.3 12/08/2007 2040   VLDL 40 12/08/2007 2040   LDLCALC (H) 12/08/2007 2040    185        Total Cholesterol/HDL:CHD Risk Coronary Heart Disease Risk Table                     Men   Women  1/2 Average Risk   3.4   3.3   Total cholesterol 175, HDL 35, LDL 102, triglycerides 193, hemoglobin 16.8, creatinine 0.9, potassium 4.1 on 04/15/17.  Physical Exam:    VS:  BP (!) 146/100   Pulse 87   Ht 6\' 1"  (1.854 m)   Wt 241 lb 12.8 oz (109.7 kg)   SpO2 96%   BMI 31.90 kg/m     Wt Readings from Last 3 Encounters:  04/28/17 241 lb 12.8 oz (109.7 kg)     GEN:  Well nourished, well developed in no acute distress HEENT: Normal NECK: No JVD; No carotid bruits LYMPHATICS: No lymphadenopathy CARDIAC: RRR, no murmurs, rubs,  gallops RESPIRATORY:  Clear to auscultation without rales, wheezing or rhonchi  ABDOMEN: Soft, non-tender, non-distended MUSCULOSKELETAL:  No edema; No deformity  SKIN: Warm and dry NEUROLOGIC:  Alert and oriented x 3 PSYCHIATRIC:  Normal affect, mildly anxious  ASSESSMENT:    1. Atypical chest pain   2. Palpitations   3. Pure hypercholesterolemia    PLAN:    In order of problems listed above:  Atypical chest pain -He has had multiple emergency department visits.  Pain seems quite atypical and possibly musculoskeletal having discomfort in not only the left upper chest but also right and mid epigastric region.  Could be associated with his hockey playing.  Nonetheless, I think that we can help calm his fears especially in light of his father's coronary disease history at age 81 and order a CT scan of coronaries.  EKG today shows nonspecific T wave changes, poor R wave progression, left anterior fascicular block.  Abnormalities noted.  Continue to encourage healthy lifestyle, statin use, good blood pressure control.  Weight loss.  Shortness of breath noted, he states that he does have asthma.  Abnormal EKG -As above, checking coronary CT. - We will give him metoprolol 50 mg once prior to his scan.  Obesity -Continue to encourage weight loss.  His girlfriend, who currently lives in [place], runs marathons.  He wants to get back into shape.  Hyperlipidemia -Continue with Crestor.  Excellent results reviewed as above.  Medication Adjustments/Labs and Tests Ordered: Current medicines are reviewed at length with the patient today.  Concerns regarding medicines are outlined above.  Orders Placed This Encounter  Procedures  . CT CORONARY MORPH W/CTA COR W/SCORE W/CA W/CM &/OR WO/CM  . CT CORONARY FRACTIONAL FLOW RESERVE DATA PREP  . CT CORONARY FRACTIONAL FLOW RESERVE FLUID ANALYSIS   Meds ordered this encounter  Medications  . metoprolol tartrate (LOPRESSOR) 50 MG tablet    Sig:  Take 1 tablet (50 mg total) by mouth once for 1 dose. Take morning of your CT scan    Dispense:  1 tablet    Refill:  0    Signed, Donato Schultz, MD  04/28/2017 1:56 PM    [facility]

## 2017-04-28 NOTE — Patient Instructions (Addendum)
Medication Instructions:  Continue all medications as listed. Please take Lopressor 50 mg the morning of your CT scan.  Labwork: Not needed/Had recently.  Testing/Procedures: Your physician has requested that you have cardiac CT. Cardiac computed tomography (CT) is a painless test that uses an x-ray machine to take clear, detailed pictures of your heart. For further information please visit [email or web address]. Please follow instruction sheet as given.  Follow-Up: Follow up as needed after the above testing.  Thank you for choosing [facility]!!    Instructions for CT scan:  You will be contacted with date and time.  Please [facility] of [facility] at xx:xx AM (30-45 minutes prior to test start time)  [facility] [address] [place], [place] [address] [phone]  Proceed to the [facility] Radiology Department (First Floor).  Please follow these instructions carefully (unless otherwise directed):  Hold all erectile dysfunction medications at least 48 hours prior to test.  On the Night Before the Test: . Drink plenty of water. . Do not consume any caffeinated/decaffeinated beverages or chocolate 12 hours prior to your test. . Do not take any antihistamines 12 hours prior to your test.  On the Day of the Test: . Drink plenty of water. Do not drink any water within one hour of the test. . Do not eat any food 4 hours prior to the test. . You may take your regular medications prior to the test. . IF NOT ON A BETA BLOCKER - Take 50 mg of lopressor (metoprolol) one hour before the test. . HOLD Furosemide morning of the test.  After the Test: . Drink plenty of water. . After receiving IV contrast, you may experience a mild flushed feeling. This is normal. . On occasion, you may experience a mild rash up to 24 hours after the test. This is not dangerous. If this occurs, you can take Benadryl 25 mg and increase  your fluid intake. . If you experience trouble breathing, this can be serious. If it is severe call 911 IMMEDIATELY. If it is mild, please call our office. . If you take any of these medications: Glipizide/Metformin, Avandament, Glucavance, please do not take 48 hours after completing test.

## 2017-04-29 ENCOUNTER — Other Ambulatory Visit: Payer: Self-pay

## 2017-04-29 MED ORDER — METOPROLOL TARTRATE 50 MG PO TABS: 50.0000 mg | ORAL_TABLET | Freq: Once | ORAL | 3 refills | Status: DC

## 2017-05-07 NOTE — Addendum Note (Signed)
Addended by: [place] Every S on: 05/07/2017 09:32 AM   Modules accepted: Orders  

## 2017-06-10 DIAGNOSIS — J4540 Moderate persistent asthma, uncomplicated: Secondary | ICD-10-CM | POA: Diagnosis not present

## 2017-06-10 DIAGNOSIS — E782 Mixed hyperlipidemia: Secondary | ICD-10-CM | POA: Diagnosis not present

## 2017-06-22 ENCOUNTER — Ambulatory Visit (HOSPITAL_COMMUNITY)
Admission: RE | Admit: 2017-06-22 | Discharge: 2017-06-22 | Disposition: A | Discharge status: Home / Self Care | Payer: Commercial Managed Care - PPO | Source: Ambulatory Visit | Attending: Cardiology | Admitting: Cardiology

## 2017-06-22 DIAGNOSIS — R110 Nausea: Secondary | ICD-10-CM | POA: Insufficient documentation

## 2017-06-22 DIAGNOSIS — K760 Fatty (change of) liver, not elsewhere classified: Secondary | ICD-10-CM | POA: Insufficient documentation

## 2017-06-22 DIAGNOSIS — R002 Palpitations: Secondary | ICD-10-CM | POA: Insufficient documentation

## 2017-06-22 DIAGNOSIS — R918 Other nonspecific abnormal finding of lung field: Secondary | ICD-10-CM | POA: Diagnosis not present

## 2017-06-22 DIAGNOSIS — R079 Chest pain, unspecified: Secondary | ICD-10-CM | POA: Diagnosis not present

## 2017-06-22 DIAGNOSIS — R0789 Other chest pain: Secondary | ICD-10-CM

## 2017-06-22 DIAGNOSIS — K449 Diaphragmatic hernia without obstruction or gangrene: Secondary | ICD-10-CM | POA: Insufficient documentation

## 2017-06-22 DIAGNOSIS — R61 Generalized hyperhidrosis: Secondary | ICD-10-CM | POA: Insufficient documentation

## 2017-06-22 MED ORDER — METOPROLOL TARTRATE 5 MG/5ML IV SOLN
5.0000 mg | INTRAVENOUS | Status: DC | PRN
Start: 2017-06-22 — End: 2017-06-23
  Administered 2017-06-22 (×3): 5 mg via INTRAVENOUS

## 2017-06-22 MED ORDER — IOPAMIDOL (ISOVUE-370) INJECTION 76%
INTRAVENOUS | Status: AC
  Administered 2017-06-22: 100 mL
  Filled 2017-06-22: qty 100

## 2017-06-22 MED ORDER — IOPAMIDOL (ISOVUE-300) INJECTION 61%
INTRAVENOUS | Status: AC
  Filled 2017-06-22: qty 100

## 2017-06-22 MED ORDER — NITROGLYCERIN 0.4 MG SL SUBL
0.8000 mg | SUBLINGUAL_TABLET | Freq: Once | SUBLINGUAL | Status: AC
  Administered 2017-06-22: 0.8 mg via SUBLINGUAL

## 2017-06-22 MED ORDER — SODIUM CHLORIDE 0.9 % IV SOLN
Freq: Once | INTRAVENOUS | Status: AC
  Administered 2017-06-22: 15:00:00 via INTRAVENOUS

## 2017-06-22 MED ORDER — METOPROLOL TARTRATE 5 MG/5ML IV SOLN
INTRAVENOUS | Status: AC
  Administered 2017-06-22: 5 mg via INTRAVENOUS
  Filled 2017-06-22: qty 15

## 2017-06-22 MED ORDER — NITROGLYCERIN 0.4 MG SL SUBL
SUBLINGUAL_TABLET | SUBLINGUAL | Status: AC
  Administered 2017-06-22: 0.8 mg via SUBLINGUAL
  Filled 2017-06-22: qty 2

## 2017-06-22 NOTE — Progress Notes (Signed)
Following CT Scan patient felt dizzy, nauseous, became diaphoretic and pale.  Called Dr. Eden EmmsNishan and NS bolus 250mL given.

## 2017-07-07 ENCOUNTER — Encounter: Payer: Self-pay | Admitting: *Deleted

## 2017-08-05 DIAGNOSIS — E291 Testicular hypofunction: Secondary | ICD-10-CM | POA: Diagnosis not present

## 2017-10-21 DIAGNOSIS — J309 Allergic rhinitis, unspecified: Secondary | ICD-10-CM | POA: Diagnosis not present

## 2017-10-21 DIAGNOSIS — J4540 Moderate persistent asthma, uncomplicated: Secondary | ICD-10-CM | POA: Diagnosis not present

## 2017-11-25 DIAGNOSIS — I10 Essential (primary) hypertension: Secondary | ICD-10-CM | POA: Diagnosis not present

## 2018-03-09 DIAGNOSIS — H6692 Otitis media, unspecified, left ear: Secondary | ICD-10-CM | POA: Diagnosis not present

## 2018-04-22 DIAGNOSIS — J4530 Mild persistent asthma, uncomplicated: Secondary | ICD-10-CM | POA: Diagnosis not present

## 2018-04-22 DIAGNOSIS — I10 Essential (primary) hypertension: Secondary | ICD-10-CM | POA: Diagnosis not present

## 2018-04-22 DIAGNOSIS — Z79899 Other long term (current) drug therapy: Secondary | ICD-10-CM | POA: Diagnosis not present

## 2018-04-22 DIAGNOSIS — E782 Mixed hyperlipidemia: Secondary | ICD-10-CM | POA: Diagnosis not present

## 2018-05-02 ENCOUNTER — Emergency Department (HOSPITAL_COMMUNITY): Payer: Commercial Managed Care - PPO

## 2018-05-02 ENCOUNTER — Emergency Department (HOSPITAL_COMMUNITY)
Admission: EM | Admit: 2018-05-02 | Discharge: 2018-05-03 | Disposition: A | Discharge status: Home / Self Care | Payer: Commercial Managed Care - PPO | Attending: Emergency Medicine | Admitting: Emergency Medicine

## 2018-05-02 ENCOUNTER — Encounter (HOSPITAL_COMMUNITY): Payer: Self-pay | Admitting: *Deleted

## 2018-05-02 DIAGNOSIS — Y9322 Activity, ice hockey: Secondary | ICD-10-CM | POA: Diagnosis not present

## 2018-05-02 DIAGNOSIS — Z79899 Other long term (current) drug therapy: Secondary | ICD-10-CM | POA: Diagnosis not present

## 2018-05-02 DIAGNOSIS — S82832A Other fracture of upper and lower end of left fibula, initial encounter for closed fracture: Secondary | ICD-10-CM | POA: Diagnosis not present

## 2018-05-02 DIAGNOSIS — W2209XA Striking against other stationary object, initial encounter: Secondary | ICD-10-CM | POA: Insufficient documentation

## 2018-05-02 DIAGNOSIS — S99912A Unspecified injury of left ankle, initial encounter: Secondary | ICD-10-CM | POA: Diagnosis present

## 2018-05-02 DIAGNOSIS — Y999 Unspecified external cause status: Secondary | ICD-10-CM | POA: Diagnosis not present

## 2018-05-02 DIAGNOSIS — S8292XA Unspecified fracture of left lower leg, initial encounter for closed fracture: Secondary | ICD-10-CM | POA: Diagnosis not present

## 2018-05-02 DIAGNOSIS — S82892A Other fracture of left lower leg, initial encounter for closed fracture: Secondary | ICD-10-CM

## 2018-05-02 DIAGNOSIS — S89392A Other physeal fracture of lower end of left fibula, initial encounter for closed fracture: Secondary | ICD-10-CM | POA: Insufficient documentation

## 2018-05-02 DIAGNOSIS — I10 Essential (primary) hypertension: Secondary | ICD-10-CM | POA: Insufficient documentation

## 2018-05-02 DIAGNOSIS — Y92330 Ice skating rink (indoor) (outdoor) as the place of occurrence of the external cause: Secondary | ICD-10-CM | POA: Diagnosis not present

## 2018-05-02 MED ORDER — ACETAMINOPHEN 500 MG PO TABS
1000.0000 mg | ORAL_TABLET | Freq: Once | ORAL | Status: AC
Start: 2018-05-02 — End: 2018-05-02
  Administered 2018-05-02: 1000 mg via ORAL
  Filled 2018-05-02: qty 2

## 2018-05-02 NOTE — ED Notes (Signed)
Returned from xray

## 2018-05-02 NOTE — ED Triage Notes (Signed)
To ED for eval after injuring left ankle during a hockey game. Pt was seen by EMS and left ankle splinted. Pt was brought by pov to ED. Sensation to both feet feel same per pt.

## 2018-05-02 NOTE — ED Notes (Signed)
To x-ray

## 2018-05-02 NOTE — ED Provider Notes (Signed)
[facility] EMERGENCY DEPARTMENT Provider Note   CSN: [id number] Arrival date & time: 05/02/18  2310     History   Chief Complaint Chief Complaint  Patient presents with  . Ankle Pain    HPI Joshua Phelps is a 47 y.o. male.  The history is provided by the patient and medical records.  Ankle Pain       46 y.o. M with hx of anxiety, asthma, HLP, HTN, presenting to the ED for left ankle injury.  States he was playing hockey when players got caught in his skate causing him to run into the wall foot first in a jam type motion.  States immediate onset of pain.  He was evaluated by EMS on scene and placed into splint, had his wife drive him here.  Denies numbness/tingling of the feet.  No prior ankle injuries or surgeries.  Past Medical History:  Diagnosis Date  . Abnormal nuclear stress test 12/2007   NO ISCHEMIA, LOW RISK, NORMAL EJECTION FRACTION, 15 METS (PSEUDONORMALIZATION OF T WAVES DURING STRESS)  . Anxiety   . Asthma   . Dyspepsia and disorder of function of stomach   . Esophageal reflux   . High cholesterol   . Hyperlipidemia   . Hypertension   . Hypogonadism male     Patient Active Problem List   Diagnosis Date Noted  . Palpitations 04/28/2017    History reviewed. No pertinent surgical history.      Home Medications    Prior to Admission medications   Medication Sig Start Date End Date Taking? Authorizing Provider  albuterol (PROVENTIL HFA;VENTOLIN HFA) 108 (90 Base) MCG/ACT inhaler Inhale 1-2 puffs into the lungs every 6 (six) hours as needed for wheezing or shortness of breath.    [provider]  ASMANEX 30 METERED DOSES 220 MCG/INH inhaler Inhale 1 puff into the lungs every evening. 11/12/15   [provider]  aspirin EC 81 MG tablet Take 81 mg by mouth daily.    [provider]  ibuprofen (ADVIL,MOTRIN) 200 MG tablet Take 600 mg by mouth every 6 (six) hours as needed (pain).    [provider]    lisinopril-hydrochlorothiazide (PRINZIDE,ZESTORETIC) 20-12.5 MG tablet Take 1 tablet by mouth daily. 11/09/15   [provider]  metoprolol tartrate (LOPRESSOR) 50 MG tablet Take 1 tablet (50 mg total) by mouth once for 1 dose. Take morning of your CT scan 04/29/17 04/29/17  [name], MD  montelukast (SINGULAIR) 10 MG tablet Take 10 mg by mouth every evening. 11/09/15   [provider]  Multiple Vitamins-Minerals (MULTIVITAMIN ADULT PO) Take 1 tablet by mouth daily.    [provider]  omeprazole (PRILOSEC) 40 MG capsule Take 40 mg by mouth daily. 11/09/15   [provider]  rosuvastatin (CRESTOR) 10 MG tablet Take 10 mg by mouth daily. 04/06/17   [provider]  tadalafil (CIALIS) 5 MG tablet Take 5 mg by mouth daily.    [provider]  testosterone cypionate (DEPOTESTOSTERONE CYPIONATE) 200 MG/ML injection Inject 1 mL into the muscle every 7 (seven) days. 09/05/15   [provider]    Family History Family History  Problem Relation Age of Onset  . Cancer Father 51  . Heart attack Father   . Emphysema Father   . Clotting disorder Father   . Hypertension Father   . Cancer Mother 4  . Hypertension Mother   . Anxiety disorder Sister 30    Social History  Social History   Tobacco Use  . Smoking status: Never Smoker  . Smokeless tobacco: Never Used  Substance Use Topics  . Alcohol use: Yes  . Drug use: No     Allergies   Patient has no known allergies.   Review of Systems Review of Systems  Musculoskeletal: Positive for arthralgias.  All other systems reviewed and are negative.    Physical Exam Updated Vital Signs BP (!) 134/97   Pulse 97   Temp 98.6 F (37 C) (Oral)   Resp 16   SpO2 95%   Physical Exam  Constitutional: He is oriented to person, place, and time. He appears well-developed and well-nourished.  HENT:  Head: Normocephalic and atraumatic.  Mouth/Throat: Oropharynx is clear and moist.   Eyes: Pupils are equal, round, and reactive to light. Conjunctivae and EOM are normal.  Neck: Normal range of motion.  Cardiovascular: Normal rate, regular rhythm and normal heart sounds.  Pulmonary/Chest: Effort normal and breath sounds normal. No stridor. No respiratory distress.  Abdominal: Soft. Bowel sounds are normal.  Musculoskeletal: Normal range of motion.  Left ankle with deformity along medial aspect, swelling along lateral malleolus, there is overlying abrasion along medial aspect; no skin tenting; DP pulse intact, moving toes normally; normal distal sensation and perfusion  Neurological: He is alert and oriented to person, place, and time.  Skin: Skin is warm and dry.  Psychiatric: He has a normal mood and affect.  Nursing note and vitals reviewed.    ED Treatments / Results  Labs (all labs ordered are listed, but only abnormal results are displayed) Labs Reviewed - No data to display  EKG None  Radiology Dg Ankle Complete Left  Result Date: 05/02/2018 CLINICAL DATA:  Pain after fall EXAM: LEFT ANKLE COMPLETE - 3+ VIEW COMPARISON:  None. FINDINGS: There is a fracture through the distal fibula. The distal tibia is grossly intact. There is disruption of the ankle mortise with widening medially. IMPRESSION: Fracture of the distal fibula with disruption of the ankle mortise and widening of the mortise medially. Joint effusion. No identified tibial fracture. Electronically Signed   By: Gerome Samavid  Williams III M.D   On: 05/02/2018 23:58    Procedures Procedures (including critical care time)  Medications Ordered in ED Medications  acetaminophen (TYLENOL) tablet 1,000 mg (1,000 mg Oral Given 05/02/18 2356)     Initial Impression / Assessment and Plan / ED Course  I have reviewed the triage vital signs and the nursing notes.  Pertinent labs & imaging results that were available during my care of the patient were reviewed by me and considered in my medical decision making  (see chart for details).  47 y.o. M here with left ankle injury while playing hockey-- another players stick got caught in his skate causing him to slide into the wall skate first.  Evaluated by EMS on scene, splinted, and brought here by friend.  On exam, deformity along the medial ankle with overlying abrasion but no open wound with visible bone.  There is swelling along the lateral malleolus.  DP pulses intact, moving toes normally, normal distal sensation and perfusion.  X-ray with distal fibula fracture with some widening of the ankle mortise.  Patient's left lower extremity remains neurovascularly intact.  He was placed in short leg splint with crutches.  Instructed to follow-up with orthopedics, will call in the morning for appointment.  Discharge home with pain control, discussed ice and elevation at home to help with swelling.  Remain non-weightbearing until  cleared by orthopedics.  He will return here for any new or worsening symptoms.  Final Clinical Impressions(s) / ED Diagnoses   Final diagnoses:  Closed fracture of left ankle, initial encounter    ED Discharge Orders         Ordered    oxyCODONE-acetaminophen (PERCOCET) 5-325 MG tablet  Every 4 hours PRN     05/03/18 0014    ibuprofen (ADVIL,MOTRIN) 800 MG tablet  3 times daily     05/03/18 0014           Garlon Hatchet, PA-C 05/03/18 0023    Gilda Crease, MD 05/03/18 [phone]

## 2018-05-03 MED ORDER — IBUPROFEN 800 MG PO TABS: 800.0000 mg | ORAL_TABLET | Freq: Three times a day (TID) | ORAL | 0 refills | Status: DC

## 2018-05-03 MED ORDER — OXYCODONE-ACETAMINOPHEN 5-325 MG PO TABS: 1.0000 | ORAL_TABLET | ORAL | 0 refills | Status: DC | PRN

## 2018-05-03 NOTE — ED Notes (Signed)
Pt demonstrated proper use of crutches. Peripheral wnl post splint placement

## 2018-05-03 NOTE — Progress Notes (Signed)
Orthopedic Tech Progress Note Patient Details:  Yolande JollyMichael J Zalar [birth year] [id number]  Ortho Devices Type of Ortho Device: Crutches, Post (short leg) splint Ortho Device/Splint Interventions: Ordered, Application, Adjustment   Post Interventions Patient Tolerated: Well Instructions Provided: Care of device, Adjustment of device   Norva KarvonenWalls, Naia Ruff T 05/03/2018, 12:33 AM

## 2018-05-03 NOTE — ED Notes (Signed)
Ortho tech on way down

## 2018-05-03 NOTE — Discharge Instructions (Signed)
Take the prescribed medication as directed.  You can reserve the percocet for severe pain or just for night time if you like. Follow-up with Dr. Roda ShuttersXu (pronounced Deno Etiennehu)-- call in the morning to make appt. Try to keep the ankle elevated, leave splint in place and use crutches to get around. Return to the ED for new or worsening symptoms.

## 2018-05-06 ENCOUNTER — Ambulatory Visit (INDEPENDENT_AMBULATORY_CARE_PROVIDER_SITE_OTHER): Payer: Commercial Managed Care - PPO | Admitting: Orthopaedic Surgery

## 2018-05-06 ENCOUNTER — Other Ambulatory Visit: Payer: Self-pay

## 2018-05-06 ENCOUNTER — Encounter (HOSPITAL_BASED_OUTPATIENT_CLINIC_OR_DEPARTMENT_OTHER): Payer: Self-pay | Admitting: *Deleted

## 2018-05-06 ENCOUNTER — Telehealth (INDEPENDENT_AMBULATORY_CARE_PROVIDER_SITE_OTHER): Payer: Self-pay | Admitting: Orthopaedic Surgery

## 2018-05-06 ENCOUNTER — Encounter (INDEPENDENT_AMBULATORY_CARE_PROVIDER_SITE_OTHER): Payer: Self-pay | Admitting: Orthopaedic Surgery

## 2018-05-06 DIAGNOSIS — S82842A Displaced bimalleolar fracture of left lower leg, initial encounter for closed fracture: Secondary | ICD-10-CM | POA: Diagnosis not present

## 2018-05-06 DIAGNOSIS — S93432A Sprain of tibiofibular ligament of left ankle, initial encounter: Secondary | ICD-10-CM | POA: Diagnosis not present

## 2018-05-06 NOTE — Telephone Encounter (Signed)
See message.

## 2018-05-06 NOTE — Telephone Encounter (Signed)
Patient scheduled for jury duty on 05/17/18. He's requesting a letter to excuse him from going due to his surgery & injuries.

## 2018-05-06 NOTE — Telephone Encounter (Signed)
yes

## 2018-05-06 NOTE — Progress Notes (Signed)
Office Visit Note   Patient: Joshua Phelps           Date of Birth: 1971-02-28           MRN: [id number] Visit Date: 05/06/2018              Requested by: Gweneth Dimitri, MD [address] [place], [place] [address] PCP: Gweneth Dimitri, MD   Assessment & Plan: Visit Diagnoses:  1. Displaced bimalleolar fracture of left lower leg, initial encounter for closed fracture   2. Ankle syndesmosis disruption, left, initial encounter     Plan: Impression is displaced left bimalleolar ankle fracture with syndesmosis rupture.  X-rays were reviewed with the patient and his fiance.  Recommendation is for surgical stabilization and fixation and restoration of anatomy.  Risks and benefits and rehab and recovery were reviewed.  Questions encouraged and answered.  Elevation at all times.  We will plan on surgery on Monday.  Follow-Up Instructions: Return for 2 week postop visit.   Orders:  No orders of the defined types were placed in this encounter.  No orders of the defined types were placed in this encounter.     Procedures: No procedures performed   Clinical Data: No additional findings.   Subjective: Chief Complaint  Patient presents with  . Left Ankle - Fracture    Joshua Phelps is a 47 year old gentleman who injured his left ankle while playing hockey 4 days ago.  He presented to the ED with an ankle injury.  This revealed a bimalleolar ankle fracture with disruption of the syndesmosis.  He comes in today for follow-up.  He endorses moderate pain.  He does have swelling.   Review of Systems  Constitutional: Negative.   All other systems reviewed and are negative.    Objective: Vital Signs: There were no vitals taken for this visit.  Physical Exam  Constitutional: He is oriented to person, place, and time. He appears well-developed and well-nourished.  HENT:  Head: Normocephalic and atraumatic.  Eyes: Pupils are equal, round, and reactive to light.  Neck: Neck  supple.  Pulmonary/Chest: Effort normal.  Abdominal: Soft.  Musculoskeletal: Normal range of motion.  Neurological: He is alert and oriented to person, place, and time.  Skin: Skin is warm.  Psychiatric: He has a normal mood and affect. His behavior is normal. Judgment and thought content normal.  Nursing note and vitals reviewed.   Ortho Exam Left ankle exam shows an abrasion on the medial aspect of the medial malleolus.  There is no evidence of open injury.  He does have moderate swelling.  No neurovascular compromise. Specialty Comments:  No specialty comments available.  Imaging: No results found.   PMFS History: Patient Active Problem List   Diagnosis Date Noted  . Palpitations 04/28/2017   Past Medical History:  Diagnosis Date  . Abnormal nuclear stress test 12/2007   NO ISCHEMIA, LOW RISK, NORMAL EJECTION FRACTION, 15 METS (PSEUDONORMALIZATION OF T WAVES DURING STRESS)  . Anxiety   . Asthma   . Dyspepsia and disorder of function of stomach   . Esophageal reflux   . High cholesterol   . Hyperlipidemia   . Hypertension   . Hypogonadism male     Family History  Problem Relation Age of Onset  . Cancer Father 6  . Heart attack Father   . Emphysema Father   . Clotting disorder Father   . Hypertension Father   . Cancer Mother 34  . Hypertension Mother   .  Anxiety disorder Sister [phone]    History reviewed. No pertinent surgical history. Social History   Occupational History  . 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  Tobacco Use  . Smoking status: Never Smoker  . Smokeless tobacco: Never Used  Substance and Sexual Activity  . Alcohol use: Yes  . Drug use: No  . Sexual activity: Not on file

## 2018-05-07 ENCOUNTER — Encounter (INDEPENDENT_AMBULATORY_CARE_PROVIDER_SITE_OTHER): Payer: Self-pay

## 2018-05-07 NOTE — Telephone Encounter (Signed)
Letter made. I will mail out letter per his request.

## 2018-05-10 ENCOUNTER — Encounter (HOSPITAL_BASED_OUTPATIENT_CLINIC_OR_DEPARTMENT_OTHER): Payer: Self-pay | Admitting: Anesthesiology

## 2018-05-10 ENCOUNTER — Ambulatory Visit (HOSPITAL_BASED_OUTPATIENT_CLINIC_OR_DEPARTMENT_OTHER): Payer: Commercial Managed Care - PPO | Admitting: Anesthesiology

## 2018-05-10 ENCOUNTER — Encounter (HOSPITAL_BASED_OUTPATIENT_CLINIC_OR_DEPARTMENT_OTHER)
Admission: RE | Disposition: A | Discharge status: Home / Self Care | Payer: Self-pay | Source: Home / Self Care | Attending: Orthopaedic Surgery

## 2018-05-10 ENCOUNTER — Other Ambulatory Visit: Payer: Self-pay

## 2018-05-10 ENCOUNTER — Ambulatory Visit (HOSPITAL_COMMUNITY): Payer: Commercial Managed Care - PPO

## 2018-05-10 ENCOUNTER — Ambulatory Visit (HOSPITAL_COMMUNITY)
Admission: RE | Admit: 2018-05-10 | Discharge: 2018-05-10 | Disposition: A | Discharge status: Home / Self Care | Payer: Commercial Managed Care - PPO | Attending: Orthopaedic Surgery | Admitting: Orthopaedic Surgery

## 2018-05-10 DIAGNOSIS — S93432A Sprain of tibiofibular ligament of left ankle, initial encounter: Secondary | ICD-10-CM | POA: Insufficient documentation

## 2018-05-10 DIAGNOSIS — S82832D Other fracture of upper and lower end of left fibula, subsequent encounter for closed fracture with routine healing: Secondary | ICD-10-CM | POA: Diagnosis not present

## 2018-05-10 DIAGNOSIS — J45909 Unspecified asthma, uncomplicated: Secondary | ICD-10-CM | POA: Diagnosis not present

## 2018-05-10 DIAGNOSIS — S8262XA Displaced fracture of lateral malleolus of left fibula, initial encounter for closed fracture: Secondary | ICD-10-CM

## 2018-05-10 DIAGNOSIS — K219 Gastro-esophageal reflux disease without esophagitis: Secondary | ICD-10-CM | POA: Diagnosis not present

## 2018-05-10 DIAGNOSIS — Z79890 Hormone replacement therapy (postmenopausal): Secondary | ICD-10-CM | POA: Diagnosis not present

## 2018-05-10 DIAGNOSIS — E291 Testicular hypofunction: Secondary | ICD-10-CM | POA: Insufficient documentation

## 2018-05-10 DIAGNOSIS — I10 Essential (primary) hypertension: Secondary | ICD-10-CM | POA: Diagnosis not present

## 2018-05-10 DIAGNOSIS — Z7982 Long term (current) use of aspirin: Secondary | ICD-10-CM | POA: Insufficient documentation

## 2018-05-10 DIAGNOSIS — Z419 Encounter for procedure for purposes other than remedying health state, unspecified: Secondary | ICD-10-CM

## 2018-05-10 DIAGNOSIS — E785 Hyperlipidemia, unspecified: Secondary | ICD-10-CM | POA: Insufficient documentation

## 2018-05-10 DIAGNOSIS — Z791 Long term (current) use of non-steroidal anti-inflammatories (NSAID): Secondary | ICD-10-CM | POA: Insufficient documentation

## 2018-05-10 DIAGNOSIS — Z79899 Other long term (current) drug therapy: Secondary | ICD-10-CM | POA: Diagnosis not present

## 2018-05-10 DIAGNOSIS — E7800 Pure hypercholesterolemia, unspecified: Secondary | ICD-10-CM | POA: Diagnosis not present

## 2018-05-10 DIAGNOSIS — G8918 Other acute postprocedural pain: Secondary | ICD-10-CM | POA: Diagnosis not present

## 2018-05-10 DIAGNOSIS — X58XXXA Exposure to other specified factors, initial encounter: Secondary | ICD-10-CM | POA: Diagnosis not present

## 2018-05-10 HISTORY — DX: Personal history of other diseases of the digestive system: Z87.19

## 2018-05-10 HISTORY — PX: ORIF ANKLE FRACTURE: SHX5408

## 2018-05-10 LAB — POCT I-STAT, CHEM 8
BUN: 14 mg/dL (ref 6–20)
CALCIUM ION: 1.08 mmol/L — AB (ref 1.15–1.40)
Chloride: 102 mmol/L (ref 98–111)
Creatinine, Ser: 1.2 mg/dL (ref 0.61–1.24)
Glucose, Bld: 105 mg/dL — ABNORMAL HIGH (ref 70–99)
HCT: 53 % — ABNORMAL HIGH (ref 39.0–52.0)
Hemoglobin: 18 g/dL — ABNORMAL HIGH (ref 13.0–17.0)
Potassium: 3.6 mmol/L (ref 3.5–5.1)
Sodium: 140 mmol/L (ref 135–145)
TCO2: 26 mmol/L (ref 22–32)

## 2018-05-10 SURGERY — OPEN REDUCTION INTERNAL FIXATION (ORIF) ANKLE FRACTURE
Anesthesia: General | Site: Ankle | Laterality: Left

## 2018-05-10 MED ORDER — OXYCODONE HCL 5 MG PO TABS: 5.0000 mg | ORAL_TABLET | Freq: Three times a day (TID) | ORAL | 0 refills | Status: DC | PRN

## 2018-05-10 MED ORDER — CEFAZOLIN SODIUM-DEXTROSE 2-4 GM/100ML-% IV SOLN
2.0000 g | INTRAVENOUS | Status: AC
  Administered 2018-05-10: 2 g via INTRAVENOUS

## 2018-05-10 MED ORDER — PROMETHAZINE HCL 25 MG PO TABS: 25.0000 mg | ORAL_TABLET | Freq: Four times a day (QID) | ORAL | 1 refills | Status: DC | PRN

## 2018-05-10 MED ORDER — CLONIDINE HCL (ANALGESIA) 100 MCG/ML EP SOLN
EPIDURAL | Status: DC | PRN
  Administered 2018-05-10: 50 ug

## 2018-05-10 MED ORDER — DEXAMETHASONE SODIUM PHOSPHATE 10 MG/ML IJ SOLN
INTRAMUSCULAR | Status: DC | PRN
  Administered 2018-05-10: 10 mg via INTRAVENOUS

## 2018-05-10 MED ORDER — CHLORHEXIDINE GLUCONATE 4 % EX LIQD: 60.0000 mL | Freq: Once | CUTANEOUS | Status: DC

## 2018-05-10 MED ORDER — SENNOSIDES-DOCUSATE SODIUM 8.6-50 MG PO TABS: 1.0000 | ORAL_TABLET | Freq: Every evening | ORAL | 1 refills | Status: DC | PRN

## 2018-05-10 MED ORDER — FENTANYL CITRATE (PF) 100 MCG/2ML IJ SOLN
INTRAMUSCULAR | Status: AC
  Filled 2018-05-10: qty 2

## 2018-05-10 MED ORDER — FENTANYL CITRATE (PF) 100 MCG/2ML IJ SOLN
50.0000 ug | INTRAMUSCULAR | Status: AC | PRN
  Administered 2018-05-10: 100 ug via INTRAVENOUS
  Administered 2018-05-10 (×2): 25 ug via INTRAVENOUS

## 2018-05-10 MED ORDER — OXYCODONE HCL 5 MG PO TABS: 5.0000 mg | ORAL_TABLET | Freq: Once | ORAL | Status: DC | PRN

## 2018-05-10 MED ORDER — SCOPOLAMINE 1 MG/3DAYS TD PT72: 1.0000 | MEDICATED_PATCH | Freq: Once | TRANSDERMAL | Status: DC | PRN

## 2018-05-10 MED ORDER — PROPOFOL 10 MG/ML IV BOLUS
INTRAVENOUS | Status: DC | PRN
  Administered 2018-05-10: 200 mg via INTRAVENOUS

## 2018-05-10 MED ORDER — FENTANYL CITRATE (PF) 100 MCG/2ML IJ SOLN: 25.0000 ug | INTRAMUSCULAR | Status: DC | PRN

## 2018-05-10 MED ORDER — EPHEDRINE SULFATE 50 MG/ML IJ SOLN
INTRAMUSCULAR | Status: DC | PRN
  Administered 2018-05-10 (×2): 10 mg via INTRAVENOUS

## 2018-05-10 MED ORDER — MIDAZOLAM HCL 2 MG/2ML IJ SOLN
1.0000 mg | INTRAMUSCULAR | Status: DC | PRN
  Administered 2018-05-10: 2 mg via INTRAVENOUS

## 2018-05-10 MED ORDER — OXYCODONE HCL ER 10 MG PO T12A: 10.0000 mg | EXTENDED_RELEASE_TABLET | Freq: Two times a day (BID) | ORAL | 0 refills | Status: AC

## 2018-05-10 MED ORDER — MIDAZOLAM HCL 2 MG/2ML IJ SOLN
INTRAMUSCULAR | Status: AC
  Filled 2018-05-10: qty 2

## 2018-05-10 MED ORDER — LACTATED RINGERS IV SOLN
INTRAVENOUS | Status: DC
  Administered 2018-05-10 (×2): via INTRAVENOUS

## 2018-05-10 MED ORDER — ONDANSETRON HCL 4 MG PO TABS: 4.0000 mg | ORAL_TABLET | Freq: Three times a day (TID) | ORAL | 0 refills | Status: DC | PRN

## 2018-05-10 MED ORDER — LIDOCAINE 2% (20 MG/ML) 5 ML SYRINGE
INTRAMUSCULAR | Status: AC
  Filled 2018-05-10: qty 5

## 2018-05-10 MED ORDER — LACTATED RINGERS IV SOLN: INTRAVENOUS | Status: DC

## 2018-05-10 MED ORDER — OXYCODONE HCL 5 MG/5ML PO SOLN: 5.0000 mg | Freq: Once | ORAL | Status: DC | PRN

## 2018-05-10 MED ORDER — ZINC SULFATE 220 (50 ZN) MG PO CAPS: 220.0000 mg | ORAL_CAPSULE | Freq: Every day | ORAL | 0 refills | Status: DC

## 2018-05-10 MED ORDER — ONDANSETRON HCL 4 MG/2ML IJ SOLN: 4.0000 mg | Freq: Once | INTRAMUSCULAR | Status: DC | PRN

## 2018-05-10 MED ORDER — ONDANSETRON HCL 4 MG/2ML IJ SOLN
INTRAMUSCULAR | Status: AC
  Filled 2018-05-10: qty 2

## 2018-05-10 MED ORDER — ASPIRIN EC 81 MG PO TBEC: 81.0000 mg | DELAYED_RELEASE_TABLET | Freq: Two times a day (BID) | ORAL | 0 refills | Status: DC

## 2018-05-10 MED ORDER — ROPIVACAINE HCL 5 MG/ML IJ SOLN
INTRAMUSCULAR | Status: DC | PRN
  Administered 2018-05-10: 20 mL via PERINEURAL
  Administered 2018-05-10: 10 mL via PERINEURAL

## 2018-05-10 MED ORDER — CEFAZOLIN SODIUM-DEXTROSE 2-4 GM/100ML-% IV SOLN
INTRAVENOUS | Status: AC
  Filled 2018-05-10: qty 100

## 2018-05-10 MED ORDER — METHOCARBAMOL 500 MG PO TABS: 500.0000 mg | ORAL_TABLET | Freq: Four times a day (QID) | ORAL | 2 refills | Status: DC | PRN

## 2018-05-10 MED ORDER — DEXAMETHASONE SODIUM PHOSPHATE 10 MG/ML IJ SOLN
INTRAMUSCULAR | Status: AC
  Filled 2018-05-10: qty 1

## 2018-05-10 MED ORDER — CALCIUM CARBONATE-VITAMIN D 500-200 MG-UNIT PO TABS: 1.0000 | ORAL_TABLET | Freq: Three times a day (TID) | ORAL | 3 refills | Status: AC

## 2018-05-10 MED ORDER — ONDANSETRON HCL 4 MG/2ML IJ SOLN
INTRAMUSCULAR | Status: DC | PRN
  Administered 2018-05-10: 4 mg via INTRAVENOUS

## 2018-05-10 MED ORDER — LIDOCAINE 2% (20 MG/ML) 5 ML SYRINGE
INTRAMUSCULAR | Status: DC | PRN
  Administered 2018-05-10: 100 mg via INTRAVENOUS

## 2018-05-10 MED ORDER — ASPIRIN EC 81 MG PO TBEC: 81.0000 mg | DELAYED_RELEASE_TABLET | Freq: Two times a day (BID) | ORAL | 0 refills | Status: AC

## 2018-05-10 SURGICAL SUPPLY — 88 items
BANDAGE ACE 4X5 VEL STRL LF (GAUZE/BANDAGES/DRESSINGS) ×2 IMPLANT
BANDAGE ACE 6X5 VEL STRL LF (GAUZE/BANDAGES/DRESSINGS) ×2 IMPLANT
BANDAGE ESMARK 6X9 LF (GAUZE/BANDAGES/DRESSINGS) ×1 IMPLANT
BIT DRILL OVR 3.5AO QC SHRT SM (DRILL) ×1 IMPLANT
BIT DRILL QC 2.5MM SHRT EVO SM (DRILL) ×1 IMPLANT
BLADE HEX COATED 2.75 (ELECTRODE) ×2 IMPLANT
BLADE SURG 15 STRL LF DISP TIS (BLADE) ×2 IMPLANT
BLADE SURG 15 STRL SS (BLADE) ×2
BNDG COHESIVE 6X5 TAN STRL LF (GAUZE/BANDAGES/DRESSINGS) ×2 IMPLANT
BNDG ESMARK 6X9 LF (GAUZE/BANDAGES/DRESSINGS) ×2
BRUSH SCRUB EZ PLAIN DRY (MISCELLANEOUS) ×2 IMPLANT
CANISTER SUCT 1200ML W/VALVE (MISCELLANEOUS) ×2 IMPLANT
COVER BACK TABLE 60X90IN (DRAPES) ×4 IMPLANT
COVER MAYO STAND STRL (DRAPES) IMPLANT
COVER WAND RF STERILE (DRAPES) IMPLANT
CUFF TOURNIQUET SINGLE 34IN LL (TOURNIQUET CUFF) ×2 IMPLANT
DECANTER SPIKE VIAL GLASS SM (MISCELLANEOUS) IMPLANT
DRAPE C-ARM 42X72 X-RAY (DRAPES) ×2 IMPLANT
DRAPE C-ARMOR (DRAPES) ×2 IMPLANT
DRAPE EXTREMITY T 121X128X90 (DRAPE) ×2 IMPLANT
DRAPE IMP U-DRAPE 54X76 (DRAPES) ×2 IMPLANT
DRAPE SURG 17X23 STRL (DRAPES) ×4 IMPLANT
DRILL OVER 3.5 AO QC SHORT SM (DRILL) ×2
DRILL QC 2.5MM SHORT EVOS SM (DRILL) ×2
DRSG PAD ABDOMINAL 8X10 ST (GAUZE/BANDAGES/DRESSINGS) ×4 IMPLANT
DURAPREP 26ML APPLICATOR (WOUND CARE) ×4 IMPLANT
ELECT REM PT RETURN 9FT ADLT (ELECTROSURGICAL) ×2
ELECTRODE REM PT RTRN 9FT ADLT (ELECTROSURGICAL) ×1 IMPLANT
GAUZE SPONGE 4X4 12PLY STRL (GAUZE/BANDAGES/DRESSINGS) ×2 IMPLANT
GAUZE XEROFORM 1X8 LF (GAUZE/BANDAGES/DRESSINGS) ×2 IMPLANT
GLOVE BIO SURGEON STRL SZ 6.5 (GLOVE) ×4 IMPLANT
GLOVE BIOGEL PI IND STRL 7.0 (GLOVE) ×2 IMPLANT
GLOVE BIOGEL PI INDICATOR 7.0 (GLOVE) ×2
GLOVE ECLIPSE 7.0 STRL STRAW (GLOVE) ×2 IMPLANT
GLOVE SKINSENSE NS SZ7.5 (GLOVE) ×1
GLOVE SKINSENSE STRL SZ7.5 (GLOVE) ×1 IMPLANT
GLOVE SURG SYN 7.5  E (GLOVE) ×3
GLOVE SURG SYN 7.5 E (GLOVE) ×3 IMPLANT
GOWN STRL REIN XL XLG (GOWN DISPOSABLE) ×2 IMPLANT
GOWN STRL REUS W/ TWL LRG LVL3 (GOWN DISPOSABLE) ×2 IMPLANT
GOWN STRL REUS W/ TWL XL LVL3 (GOWN DISPOSABLE) ×1 IMPLANT
GOWN STRL REUS W/TWL LRG LVL3 (GOWN DISPOSABLE) ×2
GOWN STRL REUS W/TWL XL LVL3 (GOWN DISPOSABLE) ×1
KIT INVISIKNOT ANKLE FRACTURE (Screw) ×4 IMPLANT
MANIFOLD NEPTUNE II (INSTRUMENTS) ×2 IMPLANT
NEEDLE HYPO 22GX1.5 SAFETY (NEEDLE) IMPLANT
NS IRRIG 1000ML POUR BTL (IV SOLUTION) ×2 IMPLANT
PACK BASIN DAY SURGERY FS (CUSTOM PROCEDURE TRAY) ×2 IMPLANT
PAD CAST 3X4 CTTN HI CHSV (CAST SUPPLIES) IMPLANT
PAD CAST 4YDX4 CTTN HI CHSV (CAST SUPPLIES) IMPLANT
PADDING CAST COTTON 3X4 STRL (CAST SUPPLIES)
PADDING CAST COTTON 4X4 STRL (CAST SUPPLIES)
PADDING CAST COTTON 6X4 STRL (CAST SUPPLIES) ×6 IMPLANT
PADDING CAST SYN 6 (CAST SUPPLIES) ×1
PADDING CAST SYNTHETIC 4 (CAST SUPPLIES) ×1
PADDING CAST SYNTHETIC 4X4 STR (CAST SUPPLIES) ×1 IMPLANT
PADDING CAST SYNTHETIC 6X4 NS (CAST SUPPLIES) ×1 IMPLANT
PENCIL BUTTON HOLSTER BLD 10FT (ELECTRODE) ×2 IMPLANT
PLATE LOCK EVOS 3.5X94 8H (Plate) ×2 IMPLANT
SCREW CORT 3.5X11 ST EVOS (Screw) ×2 IMPLANT
SCREW CORT 3.5X13MM (Screw) ×4 IMPLANT
SCREW CORT EVOS ST 3.5X12 (Screw) ×2 IMPLANT
SCREW CORTEX 3.5X18 EVOS (Screw) ×2 IMPLANT
SCREW CORTEX 3.5X24MM (Screw) ×2 IMPLANT
SCREW LOCK EVOS ST 3.5X17 (Screw) ×2 IMPLANT
SCREW LOCK ST EVOS 3.5X13 (Screw) ×2 IMPLANT
SHEET MEDIUM DRAPE 40X70 STRL (DRAPES) ×2 IMPLANT
SLEEVE SCD COMPRESS KNEE MED (MISCELLANEOUS) ×2 IMPLANT
SPLINT FIBERGLASS 4X30 (CAST SUPPLIES) ×2 IMPLANT
SPONGE LAP 18X18 RF (DISPOSABLE) ×2 IMPLANT
SUCTION FRAZIER HANDLE 10FR (MISCELLANEOUS) ×1
SUCTION TUBE FRAZIER 10FR DISP (MISCELLANEOUS) ×1 IMPLANT
SUT ETHILON 3 0 PS 1 (SUTURE) ×8 IMPLANT
SUT VIC AB 0 CT1 27 (SUTURE)
SUT VIC AB 0 CT1 27XBRD ANBCTR (SUTURE) IMPLANT
SUT VIC AB 0 SH 27 (SUTURE) ×2 IMPLANT
SUT VIC AB 2-0 CT1 27 (SUTURE) ×2
SUT VIC AB 2-0 CT1 TAPERPNT 27 (SUTURE) ×2 IMPLANT
SUT VIC AB 3-0 SH 27 (SUTURE)
SUT VIC AB 3-0 SH 27X BRD (SUTURE) IMPLANT
SYR BULB 3OZ (MISCELLANEOUS) ×2 IMPLANT
SYR CONTROL 10ML LL (SYRINGE) IMPLANT
TOWEL GREEN STERILE FF (TOWEL DISPOSABLE) ×4 IMPLANT
TOWEL OR NON WOVEN STRL DISP B (DISPOSABLE) IMPLANT
TRAY DSU PREP LF (CUSTOM PROCEDURE TRAY) ×2 IMPLANT
TUBE CONNECTING 20X1/4 (TUBING) ×2 IMPLANT
UNDERPAD 30X30 (UNDERPADS AND DIAPERS) ×2 IMPLANT
YANKAUER SUCT BULB TIP NO VENT (SUCTIONS) ×2 IMPLANT

## 2018-05-10 NOTE — H&P (Signed)
PREOPERATIVE H&P  Chief Complaint: left bimalleolar ankle fracture and syndesmosis rupture  HPI: Joshua Phelps is a 47 y.o. male who presents for surgical treatment of left bimalleolar ankle fracture and syndesmosis rupture.  He denies any changes in medical history.  Past Medical History:  Diagnosis Date  . Anxiety   . Asthma   . Esophageal reflux   . High cholesterol   . History of hiatal hernia   . Hyperlipidemia   . Hypertension   . Hypogonadism male    Past Surgical History:  Procedure Laterality Date  . APPENDECTOMY     Social History   Socioeconomic History  . Marital status: Single    Spouse name: Not on file  . Number of children: Not on file  . Years of education: Not on file  . Highest education level: Not on file  Occupational History  . 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  Social Needs  . Financial resource strain: Not on file  . Food insecurity:    Worry: Not on file    Inability: Not on file  . Transportation needs:    Medical: Not on file    Non-medical: Not on file  Tobacco Use  . Smoking status: Never Smoker  . Smokeless tobacco: Never Used  Substance and Sexual Activity  . Alcohol use: Yes  . Drug use: No  . Sexual activity: Not on file  Lifestyle  . Physical activity:    Days per week: Not on file    Minutes per session: Not on file  . Stress: Not on file  Relationships  . Social connections:    Talks on phone: Not on file    Gets together: Not on file    Attends religious service: Not on file    Active member of club or organization: Not on file    Attends meetings of clubs or organizations: Not on file    Relationship status: Not on file  Other Topics Concern  . Not on file  Social History Narrative  . Not on file   Family History  Problem Relation Age of Onset  . Cancer Father [id number]  . Heart attack Father   . Emphysema Father   . Clotting disorder Father   . Hypertension Father   . Cancer Mother [phone]  . Hypertension Mother     . Anxiety disorder Sister [phone]   No Known Allergies Prior to Admission medications   Medication Sig Start Date End Date Taking? Authorizing Provider  albuterol (PROVENTIL HFA;VENTOLIN HFA) 108 (90 Base) MCG/ACT inhaler Inhale 1-2 puffs into the lungs every 6 (six) hours as needed for wheezing or shortness of breath.   Yes [provider]  ASMANEX 30 METERED DOSES 220 MCG/INH inhaler Inhale 1 puff into the lungs every evening. 11/12/15  Yes [provider]  aspirin EC 81 MG tablet Take 81 mg by mouth daily.   Yes [provider]  ibuprofen (ADVIL,MOTRIN) 800 MG tablet Take 1 tablet (800 mg total) by mouth 3 (three) times daily. 05/03/18  Yes Garlon HatchetSanders, Lisa M, PA-C  lisinopril-hydrochlorothiazide (PRINZIDE,ZESTORETIC) 20-12.5 MG tablet Take 1 tablet by mouth daily. 11/09/15  Yes [provider]  montelukast (SINGULAIR) 10 MG tablet Take 10 mg by mouth every evening. 11/09/15  Yes [provider]  omeprazole (PRILOSEC) 40 MG capsule Take 40 mg by mouth daily. 11/09/15  Yes [provider]  rosuvastatin (CRESTOR) 10 MG tablet Take 10 mg by mouth daily. 04/06/17  Yes [provider]  testosterone cypionate (DEPOTESTOSTERONE CYPIONATE) 200 MG/ML injection Inject 1 mL into the muscle every 7 (seven) days. 09/05/15  Yes [provider]  Multiple Vitamins-Minerals (MULTIVITAMIN ADULT PO) Take 1 tablet by mouth daily.    [provider]  tadalafil (CIALIS) 5 MG tablet Take 5 mg by mouth daily.    [provider]     Positive ROS: All other systems have been reviewed and were otherwise negative with the exception of those mentioned in the HPI and as above.  Physical Exam: General: Alert, no acute distress Cardiovascular: No pedal edema Respiratory: No cyanosis, no use of accessory musculature GI: abdomen soft Skin: No lesions in the area of chief complaint Neurologic: Sensation intact distally Psychiatric: Patient is  competent for consent with normal mood and affect Lymphatic: no lymphedema  MUSCULOSKELETAL: exam stable  Assessment: left bimalleolar ankle fracture and syndesmosis rupture  Plan: Plan for Procedure(s): OPEN REDUCTION INTERNAL FIXATION (ORIF) LEFT BIMALLEOLAR ANKLE AND SYNDESMOSIS  The risks benefits and alternatives were discussed with the patient including but not limited to the risks of nonoperative treatment, versus surgical intervention including infection, bleeding, nerve injury,  blood clots, cardiopulmonary complications, morbidity, mortality, among others, and they were willing to proceed.   Glee Arvin, MD   05/10/2018 12:29 PM

## 2018-05-10 NOTE — Anesthesia Preprocedure Evaluation (Addendum)
Anesthesia Evaluation  Patient identified by MRN, date of birth, ID band Patient awake    Reviewed: Allergy & Precautions, NPO status , Patient's Chart, lab work & pertinent test results  History of Anesthesia Complications Negative for: history of anesthetic complications  Airway Mallampati: III  TM Distance: >3 FB Neck ROM: Full    Dental no notable dental hx.    Pulmonary asthma ,    Pulmonary exam normal        Cardiovascular hypertension, Normal cardiovascular exam     Neuro/Psych negative neurological ROS  negative psych ROS   GI/Hepatic Neg liver ROS, hiatal hernia, GERD  ,  Endo/Other  negative endocrine ROS  Renal/GU negative Renal ROS  negative genitourinary   Musculoskeletal negative musculoskeletal ROS (+)   Abdominal   Peds  Hematology negative hematology ROS (+)   Anesthesia Other Findings   Reproductive/Obstetrics                            Anesthesia Physical Anesthesia Plan  ASA: II  Anesthesia Plan: General   Post-op Pain Management: GA combined w/ Regional for post-op pain   Induction: Intravenous  PONV Risk Score and Plan: 2 and Ondansetron, Dexamethasone, Midazolam and Treatment may vary due to age or medical condition  Airway Management Planned: LMA  Additional Equipment: None  Intra-op Plan:   Post-operative Plan: Extubation in OR  Informed Consent: I have reviewed the patients History and Physical, chart, labs and discussed the procedure including the risks, benefits and alternatives for the proposed anesthesia with the patient or authorized representative who has indicated his/her understanding and acceptance.     Plan Discussed with:   Anesthesia Plan Comments:        Anesthesia Quick Evaluation

## 2018-05-10 NOTE — Transfer of Care (Signed)
Immediate Anesthesia Transfer of Care Note  Patient: Joshua Phelps  Procedure(s) Performed: OPEN REDUCTION INTERNAL FIXATION (ORIF) LEFT BIMALLEOLAR ANKLE AND SYNDESMOSIS (Left Ankle)  Patient Location: PACU  Anesthesia Type:GA combined with regional for post-op pain  Level of Consciousness: sedated  Airway & Oxygen Therapy: Patient Spontanous Breathing and Patient connected to face mask oxygen  Post-op Assessment: Report given to RN and Post -op Vital signs reviewed and stable  Post vital signs: Reviewed and stable  Last Vitals:  Vitals Value Taken Time  BP 113/65 05/10/2018  5:05 PM  Temp 36.6 C 05/10/2018  5:05 PM  Pulse 81 05/10/2018  5:07 PM  Resp 13 05/10/2018  5:07 PM  SpO2 96 % 05/10/2018  5:07 PM  Vitals shown include unvalidated device data.  Last Pain:  Vitals:   05/10/18 1412  TempSrc: Oral  PainSc: 3          Complications: No apparent anesthesia complications

## 2018-05-10 NOTE — Progress Notes (Signed)
Assisted Dr. Witman with left, ultrasound guided, popliteal/saphenous block. Side rails up, monitors on throughout procedure. See vital signs in flow sheet. Tolerated Procedure well. 

## 2018-05-10 NOTE — Discharge Instructions (Signed)
°  Post Anesthesia Home Care Instructions ° °Activity: °Get plenty of rest for the remainder of the day. A responsible individual must stay with you for 24 hours following the procedure.  °For the next 24 hours, DO NOT: °-Drive a car °-Operate machinery °-Drink alcoholic beverages °-Take any medication unless instructed by your physician °-Make any legal decisions or sign important papers. ° °Meals: °Start with liquid foods such as gelatin or soup. Progress to regular foods as tolerated. Avoid greasy, spicy, heavy foods. If nausea and/or vomiting occur, drink only clear liquids until the nausea and/or vomiting subsides. Call your physician if vomiting continues. ° °Special Instructions/Symptoms: °Your throat may feel dry or sore from the anesthesia or the breathing tube placed in your throat during surgery. If this causes discomfort, gargle with warm salt water. The discomfort should disappear within 24 hours. ° °If you had a scopolamine patch placed behind your ear for the management of post- operative nausea and/or vomiting: ° °1. The medication in the patch is effective for 72 hours, after which it should be removed.  Wrap patch in a tissue and discard in the trash. Wash hands thoroughly with soap and water. °2. You may remove the patch earlier than 72 hours if you experience unpleasant side effects which may include dry mouth, dizziness or visual disturbances. °3. Avoid touching the patch. Wash your hands with soap and water after contact with the patch. °   ° ° °Regional Anesthesia Blocks ° °1. Numbness or the inability to move the "blocked" extremity may last from 3-48 hours after placement. The length of time depends on the medication injected and your individual response to the medication. If the numbness is not going away after 48 hours, call your surgeon. ° °2. The extremity that is blocked will need to be protected until the numbness is gone and the  Strength has returned. Because you cannot feel it, you  will need to take extra care to avoid injury. Because it may be weak, you may have difficulty moving it or using it. You may not know what position it is in without looking at it while the block is in effect. ° °3. For blocks in the legs and feet, returning to weight bearing and walking needs to be done carefully. You will need to wait until the numbness is entirely gone and the strength has returned. You should be able to move your leg and foot normally before you try and bear weight or walk. You will need someone to be with you when you first try to ensure you do not fall and possibly risk injury. ° °4. Bruising and tenderness at the needle site are common side effects and will resolve in a few days. ° °5. Persistent numbness or new problems with movement should be communicated to the surgeon or the [facility] ([phone])/ [facility] ([phone]). ° ° ° ° ° °1. Keep splint clean and dry °2. Elevate foot above level of the heart °3. Take aspirin to prevent blood clots °4. Take pain meds as needed °5. Strict non weight bearing to operative extremity ° °

## 2018-05-10 NOTE — Anesthesia Postprocedure Evaluation (Signed)
Anesthesia Post Note  Patient: Joshua JollyMichael J Phelps  Procedure(s) Performed: OPEN REDUCTION INTERNAL FIXATION (ORIF) LEFT BIMALLEOLAR ANKLE AND SYNDESMOSIS (Left Ankle)     Patient location during evaluation: PACU Anesthesia Type: General Level of consciousness: awake and alert Pain management: pain level controlled Vital Signs Assessment: post-procedure vital signs reviewed and stable Respiratory status: spontaneous breathing, nonlabored ventilation, respiratory function stable and patient connected to nasal cannula oxygen Cardiovascular status: blood pressure returned to baseline and stable Postop Assessment: no apparent nausea or vomiting Anesthetic complications: no    Last Vitals:  Vitals:   05/10/18 1705 05/10/18 1715  BP: 113/65 112/60  Pulse: 81 81  Resp: 14 14  Temp: 36.6 C   SpO2: 97% 95%    Last Pain:  Vitals:   05/10/18 1412  TempSrc: Oral  PainSc: 3     LLE Motor Response: Purposeful movement (05/10/18 1715) LLE Sensation: Decreased (05/10/18 1715)          Lucretia Kernarolyn E Sheyenne Konz

## 2018-05-10 NOTE — Anesthesia Procedure Notes (Signed)
Procedure Name: LMA Insertion Date/Time: 05/10/2018 3:33 PM Performed by: Burna Cashonrad, Anahis Furgeson C, CRNA Pre-anesthesia Checklist: Patient identified, Emergency Drugs available, Suction available and Patient being monitored Patient Re-evaluated:Patient Re-evaluated prior to induction Oxygen Delivery Method: Circle system utilized Preoxygenation: Pre-oxygenation with 100% oxygen Induction Type: IV induction Ventilation: Mask ventilation without difficulty LMA: LMA inserted LMA Size: 5.0 Number of attempts: 1 Airway Equipment and Method: Bite block Placement Confirmation: positive ETCO2 Tube secured with: Tape Dental Injury: Teeth and Oropharynx as per pre-operative assessment

## 2018-05-10 NOTE — Op Note (Signed)
Date of Surgery: 05/10/2018  INDICATIONS: [name] is a 47 y.o.-year-old male who sustained a left ankle fracture; he was indicated for open reduction and internal fixation due to the displaced nature of the articular fracture and came to the operating room today for this procedure. The patient did consent to the procedure after discussion of the risks and benefits.  PREOPERATIVE DIAGNOSIS: left lateral malleolus ankle fracture with syndesmosis rupture  POSTOPERATIVE DIAGNOSIS: Same.  PROCEDURE:  1.  Open treatment of left ankle fracture with internal fixation. Lateral malleolar 2.  Open treatment of left ankle syndesmosis with internal fixation   SURGEON: N. Glee ArvinMichael Maiah Sinning, M.D.  ASSIST: Starlyn SkeansMary Lindsey Highfield-CascadeStanbery, [place]PA-C; necessary for the timely completion of procedure and due to complexity of procedure.  ANESTHESIA:  general, regional  TOURNIQUET TIME: less than 60 mins  IV FLUIDS AND URINE: See anesthesia.  ESTIMATED BLOOD LOSS: minimal mL.  IMPLANTS: [employer]  COMPLICATIONS: None.  DESCRIPTION OF PROCEDURE: The patient was brought to the operating room and placed supine on the operating table.  The patient had been signed prior to the procedure and this was documented. The patient had the anesthesia placed by the anesthesiologist.  A nonsterile tourniquet was placed on the upper thigh.  The prep verification and incision time-outs were performed to confirm that this was the correct patient, site, side and location. The patient had an SCD on the opposite lower extremity. The patient did receive antibiotics prior to the incision and was re-dosed during the procedure as needed at indicated intervals.  The patient had the lower extremity prepped and draped in the standard surgical fashion.  The extremity was exsanguinated using an esmarch bandage and the tourniquet was inflated to 300 mm Hg.  An incision was made over the lateral aspect of the distal fibula.  Full-thickness flaps  were elevated.  Subperiosteal elevation was performed.  The fracture was exposed.  Organized hematoma was removed from the fracture site.  The fracture lines were visualized and reduction was achieved.  This was then provisionally clamped and confirmed under fluoroscopy on orthogonal views.  We then placed a single anterior to posterior lag screw by technique with excellent purchase.  The clamp was removed.  We then placed a semitubular plate on the lateral aspect of the fibula at the appropriate position.  I then placed 2 nonlocking screws through the proximal 2 holes of the plate bicortically.  This had excellent fixation.  I then placed 3 unicortical locking screws distal to the fracture.  Care was taken not to penetrate the joint.  After this was done I then performed a stress test which revealed widening of the medial clear space.  I then used a large periarticular clamp to reduce the syndesmosis.  A stab incision was made on the medial side of the distal tibia for the clamp.  With the syndesmosis reduced I then placed 2 tight rope construct parallel to the ankle joint using fluoroscopic guidance for fixation of the syndesmosis.  The clamp was then removed and the syndesmosis remained reduced.  Final x-rays were taken.  The surgical wounds were then thoroughly irrigated with normal saline.  They were closed in layered fashion using 0 Vicryl, 2-0 Vicryl, 3-0 nylon.  Sterile dressings were applied.  The foot was placed in a short leg splint at 90 degrees.  Patient tolerated procedure well had no immediate complications.  POSTOPERATIVE PLAN: [name] will remain nonweightbearing on this leg for approximately 6 weeks; [name] will  return for suture removal in 2 weeks.  He will be immobilized in a short leg splint and then transitioned to a CAM walker at his first follow up appointment.  Joshua Phelps will receive DVT prophylaxis based on other medications, activity level, and risk ratio of bleeding to  thrombosis.  [name], MD [facility] [phone] 4:44 PM

## 2018-05-10 NOTE — Anesthesia Procedure Notes (Signed)
Anesthesia Regional Block: Adductor canal block   Pre-Anesthetic Checklist: ,, timeout performed, Correct Patient, Correct Site, Correct Laterality, Correct Procedure, Correct Position, site marked, Risks and benefits discussed,  Surgical consent,  Pre-op evaluation,  At surgeon's request and post-op pain management  Laterality: Left  Prep: chloraprep       Needles:  Injection technique: Single-shot  Needle Type: Echogenic Stimulator Needle     Needle Length: 9cm  Needle Gauge: 21     Additional Needles:   Procedures:,,,, ultrasound used (permanent image in chart),,,,  Narrative:  Start time: 05/10/2018 2:35 PM End time: 05/10/2018 2:40 PM Injection made incrementally with aspirations every 5 mL.  Performed by: Personally  Anesthesiologist: Lucretia KernWitman, Jaimy Kliethermes E, MD  Additional Notes: Monitors applied. Injection made in 5cc increments. No resistance to injection. Good needle visualization. Patient tolerated procedure well.

## 2018-05-10 NOTE — Anesthesia Procedure Notes (Signed)
Anesthesia Regional Block: Popliteal block   Pre-Anesthetic Checklist: ,, timeout performed, Correct Patient, Correct Site, Correct Laterality, Correct Procedure, Correct Position, site marked, Risks and benefits discussed,  Surgical consent,  Pre-op evaluation,  At surgeon's request and post-op pain management  Laterality: Left  Prep: chloraprep       Needles:  Injection technique: Single-shot  Needle Type: Echogenic Stimulator Needle     Needle Length: 9cm  Needle Gauge: 21     Additional Needles:   Procedures:,,,, ultrasound used (permanent image in chart),,,,  Narrative:  Start time: 05/10/2018 2:40 PM End time: 05/10/2018 2:43 PM Injection made incrementally with aspirations every 5 mL.  Performed by: Personally  Anesthesiologist: Lucretia KernWitman, Kinsley Holderman E, MD  Additional Notes: Monitors applied. Injection made in 5cc increments. No resistance to injection. Good needle visualization. Patient tolerated procedure well.

## 2018-05-12 ENCOUNTER — Encounter (HOSPITAL_BASED_OUTPATIENT_CLINIC_OR_DEPARTMENT_OTHER): Payer: Self-pay | Admitting: Orthopaedic Surgery

## 2018-05-21 ENCOUNTER — Encounter (INDEPENDENT_AMBULATORY_CARE_PROVIDER_SITE_OTHER): Payer: Self-pay | Admitting: Orthopaedic Surgery

## 2018-05-21 ENCOUNTER — Ambulatory Visit (INDEPENDENT_AMBULATORY_CARE_PROVIDER_SITE_OTHER): Payer: Commercial Managed Care - PPO

## 2018-05-21 ENCOUNTER — Ambulatory Visit (INDEPENDENT_AMBULATORY_CARE_PROVIDER_SITE_OTHER): Payer: Commercial Managed Care - PPO | Admitting: Physician Assistant

## 2018-05-21 DIAGNOSIS — S93432A Sprain of tibiofibular ligament of left ankle, initial encounter: Secondary | ICD-10-CM

## 2018-05-21 NOTE — Progress Notes (Signed)
   Post-Op Visit Note   Patient: Joshua Phelps           Date of Birth: 09/03/1970           MRN: [id number] Visit Date: 05/21/2018 PCP: Gweneth DimitriMcNeill, Wendy, MD   Assessment & Plan:  Chief Complaint:  Chief Complaint  Patient presents with  . Left Ankle - Pain, Routine Post Op, Follow-up   Visit Diagnoses:  1. Ankle syndesmosis disruption, left, initial encounter     Plan: Patient is a pleasant 47 year old gentleman who presents to our clinic today 11 days status post ORIF left bimalleolar ankle fracture and syndesmosis fixation, date of surgery 05/10/2018.  He has been doing very well at home.  He has been compliant nonweightbearing.  He has been elevating for pain and swelling.  No fevers or chills.  Examination of left ankle reveals well-healing surgical incisions with nylon sutures in place.  He has mild to moderate swelling.  Calf is soft and nontender.  He is neurovascularly intact distally.  At this point, we will remove the stitches and apply Steri-Strips.  We will place him in a cam walker where he will be nonweightbearing until he is 6 weeks out from surgery.  At that point we will bring him back in and transition him to partial weightbearing due to syndesmosis fixation.  Follow-up with us in 4 to 5 weeks time for recheck.  Call with concerns or questions in the meantime.  Follow-Up Instructions: Return in about 5 years (around 05/22/2023).   Orders:  Orders Placed This Encounter  Procedures  . XR Ankle Complete Left   No orders of the defined types were placed in this encounter.   Imaging: Xr Ankle Complete Left  Result Date: 05/21/2018 X-rays demonstrate stable alignment of the prosthesis   PMFS History: Patient Active Problem List   Diagnosis Date Noted  . Closed fracture of left lateral malleolus 05/10/2018  . Ankle syndesmosis disruption, left, initial encounter 05/10/2018  . Palpitations 04/28/2017   Past Medical History:  Diagnosis Date  . Anxiety   .  Asthma   . Esophageal reflux   . High cholesterol   . History of hiatal hernia   . Hyperlipidemia   . Hypertension   . Hypogonadism male     Family History  Problem Relation Age of Onset  . Cancer Father [id number]  . Heart attack Father   . Emphysema Father   . Clotting disorder Father   . Hypertension Father   . Cancer Mother [id number]  . Hypertension Mother   . Anxiety disorder Sister [id number]    Past Surgical History:  Procedure Laterality Date  . APPENDECTOMY    . LASIK Bilateral 1999  . ORIF ANKLE FRACTURE Left 05/10/2018   Procedure: OPEN REDUCTION INTERNAL FIXATION (ORIF) LEFT BIMALLEOLAR ANKLE AND SYNDESMOSIS;  Surgeon: Tarry KosXu, Naiping M, MD;  Location: [facility];  Service: Orthopedics;  Laterality: Left;   Social History   Occupational History  . 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  Tobacco Use  . Smoking status: Never Smoker  . Smokeless tobacco: Never Used  Substance and Sexual Activity  . Alcohol use: Yes  . Drug use: No  . Sexual activity: Not on file

## 2018-06-04 ENCOUNTER — Telehealth (INDEPENDENT_AMBULATORY_CARE_PROVIDER_SITE_OTHER): Payer: Self-pay | Admitting: Orthopaedic Surgery

## 2018-06-04 NOTE — Telephone Encounter (Signed)
Patient called asked when should he remove the boot other than going to bed. Also, patient asked when should he change the bandages? The number to contact patient is [phone]

## 2018-06-07 NOTE — Telephone Encounter (Signed)
See message below °

## 2018-06-07 NOTE — Telephone Encounter (Signed)
He can remove the boot to shower.  There should not be any bandages to remove other than the steri strips can be peeled off once they start to come off

## 2018-06-08 ENCOUNTER — Telehealth (INDEPENDENT_AMBULATORY_CARE_PROVIDER_SITE_OTHER): Payer: Self-pay | Admitting: Orthopaedic Surgery

## 2018-06-08 NOTE — Telephone Encounter (Signed)
Patients would like to know if how long he should wear his boot. Joshua Phelps also has questions about his bandages. Please call patient to advice

## 2018-06-08 NOTE — Telephone Encounter (Signed)
Called patient no answer. LMOM to return call to advise on message below.  

## 2018-06-08 NOTE — Telephone Encounter (Signed)
See previous message

## 2018-06-08 NOTE — Telephone Encounter (Signed)
Spoke to patient

## 2018-06-16 ENCOUNTER — Ambulatory Visit (INDEPENDENT_AMBULATORY_CARE_PROVIDER_SITE_OTHER): Payer: Commercial Managed Care - PPO

## 2018-06-16 ENCOUNTER — Ambulatory Visit (INDEPENDENT_AMBULATORY_CARE_PROVIDER_SITE_OTHER): Payer: Commercial Managed Care - PPO | Admitting: Orthopaedic Surgery

## 2018-06-16 ENCOUNTER — Ambulatory Visit (INDEPENDENT_AMBULATORY_CARE_PROVIDER_SITE_OTHER): Payer: Self-pay

## 2018-06-16 ENCOUNTER — Encounter (INDEPENDENT_AMBULATORY_CARE_PROVIDER_SITE_OTHER): Payer: Self-pay | Admitting: Orthopaedic Surgery

## 2018-06-16 DIAGNOSIS — M25571 Pain in right ankle and joints of right foot: Secondary | ICD-10-CM

## 2018-06-16 DIAGNOSIS — G8929 Other chronic pain: Secondary | ICD-10-CM

## 2018-06-16 DIAGNOSIS — M25572 Pain in left ankle and joints of left foot: Secondary | ICD-10-CM | POA: Diagnosis not present

## 2018-06-16 DIAGNOSIS — S93432A Sprain of tibiofibular ligament of left ankle, initial encounter: Secondary | ICD-10-CM

## 2018-06-16 DIAGNOSIS — S82842A Displaced bimalleolar fracture of left lower leg, initial encounter for closed fracture: Secondary | ICD-10-CM | POA: Diagnosis not present

## 2018-06-16 NOTE — Progress Notes (Signed)
Office Visit Note   Patient: Joshua Phelps           Date of Birth: [birth year]           MRN: [id number] Visit Date: 06/16/2018              Requested by: Gweneth Dimitri, MD [address] [place], [place] [address] PCP: Gweneth Dimitri, MD   Assessment & Plan: Visit Diagnoses:  1. Chronic pain of both ankles   2. Ankle syndesmosis disruption, left, initial encounter   3. Displaced bimalleolar fracture of left lower leg, initial encounter for closed fracture     Plan: Impression is stable ORIF left ankle syndesmosis and overuse of his right ankle from his knee scooter.  X-rays are reassuring today.  At this point I would like to get him in physical therapy for partial weightbearing starting next week.  I will see him back in 5 weeks with three-view x-rays of the left ankle.  Questions encouraged and answered.  Follow-Up Instructions: Return in about 5 weeks (around 07/21/2018).   Orders:  Orders Placed This Encounter  Procedures  . XR Ankle Complete Left  . XR Ankle Complete Right   No orders of the defined types were placed in this encounter.     Procedures: No procedures performed   Clinical Data: No additional findings.   Subjective: Chief Complaint  Patient presents with  . Left Ankle - Routine Post Op, Follow-up  . Right Ankle - Pain, Injury    Hyland comes in today for recheck of his left ankle fixation and some mild pain in his right ankle.  He had a fall yesterday with his cam boot when he slipped down some wet stairs.  He does have some moderate pain but not severe.   Review of Systems  Constitutional: Negative.   All other systems reviewed and are negative.    Objective: Vital Signs: There were no vitals taken for this visit.  Physical Exam  Ortho Exam Left ankle exam shows a fully healed surgical scars.  He has moderate swelling.  No gross abnormality. Right ankle exam shows no focal findings.  Minimal swelling.  No tenderness to  palpation. Specialty Comments:  No specialty comments available.  Imaging: Xr Ankle Complete Left  Result Date: 06/16/2018 Stable ORIF of the left ankle without complication  Xr Ankle Complete Right  Result Date: 06/16/2018 Negative for acute findings.    PMFS History: Patient Active Problem List   Diagnosis Date Noted  . Closed fracture of left lateral malleolus 05/10/2018  . Ankle syndesmosis disruption, left, initial encounter 05/10/2018  . Palpitations 04/28/2017   Past Medical History:  Diagnosis Date  . Anxiety   . Asthma   . Esophageal reflux   . High cholesterol   . History of hiatal hernia   . Hyperlipidemia   . Hypertension   . Hypogonadism male     Family History  Problem Relation Age of Onset  . Cancer Father 52  . Heart attack Father   . Emphysema Father   . Clotting disorder Father   . Hypertension Father   . Cancer Mother 64  . Hypertension Mother   . Anxiety disorder Sister 72    Past Surgical History:  Procedure Laterality Date  . APPENDECTOMY    . LASIK Bilateral 1999  . ORIF ANKLE FRACTURE Left 05/10/2018   Procedure: OPEN REDUCTION INTERNAL FIXATION (ORIF) LEFT BIMALLEOLAR ANKLE AND SYNDESMOSIS;  Surgeon: Tarry Kos, MD;  Location:  [facility];  Service: Orthopedics;  Laterality: Left;   Social History   Occupational History  . 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  Tobacco Use  . Smoking status: Never Smoker  . Smokeless tobacco: Never Used  Substance and Sexual Activity  . Alcohol use: Yes  . Drug use: No  . Sexual activity: Not on file

## 2018-06-28 DIAGNOSIS — M25572 Pain in left ankle and joints of left foot: Secondary | ICD-10-CM | POA: Diagnosis not present

## 2018-06-28 DIAGNOSIS — M25472 Effusion, left ankle: Secondary | ICD-10-CM | POA: Diagnosis not present

## 2018-06-28 DIAGNOSIS — M25672 Stiffness of left ankle, not elsewhere classified: Secondary | ICD-10-CM | POA: Diagnosis not present

## 2018-06-29 DIAGNOSIS — M25472 Effusion, left ankle: Secondary | ICD-10-CM | POA: Diagnosis not present

## 2018-06-29 DIAGNOSIS — M25572 Pain in left ankle and joints of left foot: Secondary | ICD-10-CM | POA: Diagnosis not present

## 2018-06-29 DIAGNOSIS — M25672 Stiffness of left ankle, not elsewhere classified: Secondary | ICD-10-CM | POA: Diagnosis not present

## 2018-07-01 DIAGNOSIS — M25672 Stiffness of left ankle, not elsewhere classified: Secondary | ICD-10-CM | POA: Diagnosis not present

## 2018-07-01 DIAGNOSIS — M25572 Pain in left ankle and joints of left foot: Secondary | ICD-10-CM | POA: Diagnosis not present

## 2018-07-01 DIAGNOSIS — M25472 Effusion, left ankle: Secondary | ICD-10-CM | POA: Diagnosis not present

## 2018-07-06 DIAGNOSIS — M25572 Pain in left ankle and joints of left foot: Secondary | ICD-10-CM | POA: Diagnosis not present

## 2018-07-06 DIAGNOSIS — M25672 Stiffness of left ankle, not elsewhere classified: Secondary | ICD-10-CM | POA: Diagnosis not present

## 2018-07-06 DIAGNOSIS — M25472 Effusion, left ankle: Secondary | ICD-10-CM | POA: Diagnosis not present

## 2018-07-08 DIAGNOSIS — M25572 Pain in left ankle and joints of left foot: Secondary | ICD-10-CM | POA: Diagnosis not present

## 2018-07-08 DIAGNOSIS — M25472 Effusion, left ankle: Secondary | ICD-10-CM | POA: Diagnosis not present

## 2018-07-08 DIAGNOSIS — M25672 Stiffness of left ankle, not elsewhere classified: Secondary | ICD-10-CM | POA: Diagnosis not present

## 2018-07-09 DIAGNOSIS — M25672 Stiffness of left ankle, not elsewhere classified: Secondary | ICD-10-CM | POA: Diagnosis not present

## 2018-07-09 DIAGNOSIS — M25472 Effusion, left ankle: Secondary | ICD-10-CM | POA: Diagnosis not present

## 2018-07-09 DIAGNOSIS — M25572 Pain in left ankle and joints of left foot: Secondary | ICD-10-CM | POA: Diagnosis not present

## 2018-07-13 DIAGNOSIS — M25572 Pain in left ankle and joints of left foot: Secondary | ICD-10-CM | POA: Diagnosis not present

## 2018-07-13 DIAGNOSIS — M25672 Stiffness of left ankle, not elsewhere classified: Secondary | ICD-10-CM | POA: Diagnosis not present

## 2018-07-13 DIAGNOSIS — M25472 Effusion, left ankle: Secondary | ICD-10-CM | POA: Diagnosis not present

## 2018-07-15 DIAGNOSIS — M25572 Pain in left ankle and joints of left foot: Secondary | ICD-10-CM | POA: Diagnosis not present

## 2018-07-15 DIAGNOSIS — M25672 Stiffness of left ankle, not elsewhere classified: Secondary | ICD-10-CM | POA: Diagnosis not present

## 2018-07-15 DIAGNOSIS — M25472 Effusion, left ankle: Secondary | ICD-10-CM | POA: Diagnosis not present

## 2018-07-19 DIAGNOSIS — M25672 Stiffness of left ankle, not elsewhere classified: Secondary | ICD-10-CM | POA: Diagnosis not present

## 2018-07-19 DIAGNOSIS — M25572 Pain in left ankle and joints of left foot: Secondary | ICD-10-CM | POA: Diagnosis not present

## 2018-07-19 DIAGNOSIS — M25472 Effusion, left ankle: Secondary | ICD-10-CM | POA: Diagnosis not present

## 2018-07-20 DIAGNOSIS — M25672 Stiffness of left ankle, not elsewhere classified: Secondary | ICD-10-CM | POA: Diagnosis not present

## 2018-07-20 DIAGNOSIS — M25472 Effusion, left ankle: Secondary | ICD-10-CM | POA: Diagnosis not present

## 2018-07-20 DIAGNOSIS — M25572 Pain in left ankle and joints of left foot: Secondary | ICD-10-CM | POA: Diagnosis not present

## 2018-07-21 ENCOUNTER — Encounter (INDEPENDENT_AMBULATORY_CARE_PROVIDER_SITE_OTHER): Payer: Self-pay | Admitting: Orthopaedic Surgery

## 2018-07-21 ENCOUNTER — Ambulatory Visit (INDEPENDENT_AMBULATORY_CARE_PROVIDER_SITE_OTHER): Payer: Commercial Managed Care - PPO | Admitting: Orthopaedic Surgery

## 2018-07-21 ENCOUNTER — Ambulatory Visit (INDEPENDENT_AMBULATORY_CARE_PROVIDER_SITE_OTHER): Payer: Commercial Managed Care - PPO

## 2018-07-21 DIAGNOSIS — M25572 Pain in left ankle and joints of left foot: Secondary | ICD-10-CM | POA: Diagnosis not present

## 2018-07-21 DIAGNOSIS — S93432A Sprain of tibiofibular ligament of left ankle, initial encounter: Secondary | ICD-10-CM

## 2018-07-21 DIAGNOSIS — S8262XD Displaced fracture of lateral malleolus of left fibula, subsequent encounter for closed fracture with routine healing: Secondary | ICD-10-CM

## 2018-07-21 NOTE — Progress Notes (Signed)
   Office Visit Note   Patient: Joshua Phelps           Date of Birth: 21-Aug-1970           MRN: [id number] Visit Date: 07/21/2018              Requested by: Gweneth Dimitri, MD [address] [place], [place] [address] PCP: Gweneth Dimitri, MD   Assessment & Plan: Visit Diagnoses:  1. Closed displaced fracture of lateral malleolus of left fibula with routine healing, subsequent encounter   2. Pain in left ankle and joints of left foot   3. Ankle syndesmosis disruption, left, initial encounter     Plan: Patient is doing well at this point and can be advanced to weight-bear as tolerated.  He has weaned himself off the cam walker already.  Handicap placard was provided today.  She he is to discontinue the knee scooter.  We will see him back in 6 weeks for three-view x-rays of the left ankle.  Follow-Up Instructions: Return in about 6 weeks (around 09/01/2018).   Orders:  Orders Placed This Encounter  Procedures  . XR Ankle Complete Left   No orders of the defined types were placed in this encounter.     Procedures: No procedures performed   Clinical Data: No additional findings.   Subjective: Chief Complaint  Patient presents with  . Left Ankle - Pain    Joshua Phelps is 10 weeks status post ORIF left ankle fracture with syndesmosis injury.  He is doing well and has recently begun partially weightbearing.  He reports no significant pain.   Review of Systems   Objective: Vital Signs: There were no vitals taken for this visit.  Physical Exam  Ortho Exam Left ankle exam shows fully healed surgical scars.  Mild swelling.  Mild tenderness on the medial incisions. Specialty Comments:  No specialty comments available.  Imaging: Xr Ankle Complete Left  Result Date: 07/21/2018 Stable fixation of left ankle fracture without hardware complication.    PMFS History: Patient Active Problem List   Diagnosis Date Noted  . Closed fracture of left lateral malleolus  05/10/2018  . Ankle syndesmosis disruption, left, initial encounter 05/10/2018  . Palpitations 04/28/2017   Past Medical History:  Diagnosis Date  . Anxiety   . Asthma   . Esophageal reflux   . High cholesterol   . History of hiatal hernia   . Hyperlipidemia   . Hypertension   . Hypogonadism male     Family History  Problem Relation Age of Onset  . Cancer Father 1  . Heart attack Father   . Emphysema Father   . Clotting disorder Father   . Hypertension Father   . Cancer Mother 75  . Hypertension Mother   . Anxiety disorder Sister 60    Past Surgical History:  Procedure Laterality Date  . APPENDECTOMY    . LASIK Bilateral 1999  . ORIF ANKLE FRACTURE Left 05/10/2018   Procedure: OPEN REDUCTION INTERNAL FIXATION (ORIF) LEFT BIMALLEOLAR ANKLE AND SYNDESMOSIS;  Surgeon: Tarry Kos, MD;  Location: [facility];  Service: Orthopedics;  Laterality: Left;   Social History   Occupational History  . 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  Tobacco Use  . Smoking status: Never Smoker  . Smokeless tobacco: Never Used  Substance and Sexual Activity  . Alcohol use: Yes  . Drug use: No  . Sexual activity: Not on file

## 2018-07-22 DIAGNOSIS — M25472 Effusion, left ankle: Secondary | ICD-10-CM | POA: Diagnosis not present

## 2018-07-22 DIAGNOSIS — M25572 Pain in left ankle and joints of left foot: Secondary | ICD-10-CM | POA: Diagnosis not present

## 2018-07-22 DIAGNOSIS — M25672 Stiffness of left ankle, not elsewhere classified: Secondary | ICD-10-CM | POA: Diagnosis not present

## 2018-08-09 DIAGNOSIS — M25472 Effusion, left ankle: Secondary | ICD-10-CM | POA: Diagnosis not present

## 2018-08-09 DIAGNOSIS — M25572 Pain in left ankle and joints of left foot: Secondary | ICD-10-CM | POA: Diagnosis not present

## 2018-08-09 DIAGNOSIS — M25672 Stiffness of left ankle, not elsewhere classified: Secondary | ICD-10-CM | POA: Diagnosis not present

## 2018-08-11 DIAGNOSIS — M25472 Effusion, left ankle: Secondary | ICD-10-CM | POA: Diagnosis not present

## 2018-08-11 DIAGNOSIS — M25672 Stiffness of left ankle, not elsewhere classified: Secondary | ICD-10-CM | POA: Diagnosis not present

## 2018-08-11 DIAGNOSIS — M25572 Pain in left ankle and joints of left foot: Secondary | ICD-10-CM | POA: Diagnosis not present

## 2018-09-14 ENCOUNTER — Ambulatory Visit (INDEPENDENT_AMBULATORY_CARE_PROVIDER_SITE_OTHER): Payer: Commercial Managed Care - PPO | Admitting: Orthopaedic Surgery

## 2018-09-16 ENCOUNTER — Ambulatory Visit (INDEPENDENT_AMBULATORY_CARE_PROVIDER_SITE_OTHER): Payer: Commercial Managed Care - PPO | Admitting: Orthopaedic Surgery

## 2018-09-23 ENCOUNTER — Ambulatory Visit (INDEPENDENT_AMBULATORY_CARE_PROVIDER_SITE_OTHER): Payer: Self-pay

## 2018-09-23 ENCOUNTER — Ambulatory Visit (INDEPENDENT_AMBULATORY_CARE_PROVIDER_SITE_OTHER): Payer: Commercial Managed Care - PPO | Admitting: Orthopaedic Surgery

## 2018-09-23 ENCOUNTER — Encounter (INDEPENDENT_AMBULATORY_CARE_PROVIDER_SITE_OTHER): Payer: Self-pay | Admitting: Orthopaedic Surgery

## 2018-09-23 ENCOUNTER — Other Ambulatory Visit: Payer: Self-pay

## 2018-09-23 DIAGNOSIS — S8262XD Displaced fracture of lateral malleolus of left fibula, subsequent encounter for closed fracture with routine healing: Secondary | ICD-10-CM

## 2018-09-23 DIAGNOSIS — M79672 Pain in left foot: Secondary | ICD-10-CM

## 2018-09-23 MED ORDER — PREDNISONE 10 MG (21) PO TBPK: ORAL_TABLET | ORAL | 0 refills | Status: DC

## 2018-09-23 MED ORDER — COLCHICINE 0.6 MG PO TABS: 0.6000 mg | ORAL_TABLET | Freq: Two times a day (BID) | ORAL | 2 refills | Status: DC | PRN

## 2018-09-23 NOTE — Progress Notes (Signed)
Office Visit Note   Patient: Joshua Phelps           Date of Birth: 09/20/1970           MRN: [id number] Visit Date: 09/23/2018              Requested by: Gweneth DimitriMcNeill, Wendy, MD [address] [place], [place] [address] PCP: Gweneth DimitriMcNeill, Wendy, MD   Assessment & Plan: Visit Diagnoses:  1. Left foot pain   2. Closed displaced fracture of lateral malleolus of left fibula with routine healing, subsequent encounter     Plan: My impression is that he has inflammatory process occurring such as gout.  My suspicion for infection is low.  I recommended using a cam boot as needed.  We will do a prescription for prednisone taper and colchicine.  Patient instructed to follow-up if he does not improve or the symptoms worsen.  Follow-Up Instructions: Return if symptoms worsen or fail to improve.   Orders:  Orders Placed This Encounter  Procedures  . XR Ankle Complete Left   Meds ordered this encounter  Medications  . predniSONE (STERAPRED UNI-PAK 21 TAB) 10 MG (21) TBPK tablet    Sig: Take as directed    Dispense:  21 tablet    Refill:  0  . colchicine 0.6 MG tablet    Sig: Take 1 tablet (0.6 mg total) by mouth 2 (two) times daily as needed.    Dispense:  10 tablet    Refill:  2      Procedures: No procedures performed   Clinical Data: No additional findings.   Subjective: Chief Complaint  Patient presents with  . Left Ankle - Follow-up    Joshua Phelps is 4 months status post ORIF left bimalleolar ankle fracture and syndesmosis fixation.  He is doing well overall.  He states that in the last couple days he has had acute onset of increasing redness and pain in his left foot mainly in the big toe and heel.  He denies any symptoms in his ankle.  Denies any constitutional symptoms.  He does have a history of gout.  He states that he recently has had some red meat and beers.  He denies any recent injuries.   Review of Systems  Constitutional: Negative.   All other systems reviewed  and are negative.    Objective: Vital Signs: There were no vitals taken for this visit.  Physical Exam Vitals signs and nursing note reviewed.  Constitutional:      Appearance: He is well-developed.  Pulmonary:     Effort: Pulmonary effort is normal.  Abdominal:     Palpations: Abdomen is soft.  Skin:    General: Skin is warm.  Neurological:     Mental Status: He is alert and oriented to person, place, and time.  Psychiatric:        Behavior: Behavior normal.        Thought Content: Thought content normal.        Judgment: Judgment normal.     Ortho Exam His left foot and ankle and lower leg region are slightly red but does not appear to be consistent with cellulitis.  He has mild swelling.  He does have some redness and warmth around the big toe and some tenderness along the MTP joint along with the heel area. Specialty Comments:  No specialty comments available.  Imaging: Xr Ankle Complete Left  Result Date: 09/23/2018 Stable fixation and ankle mortise    PMFS History:  Patient Active Problem List   Diagnosis Date Noted  . Left foot pain 09/23/2018  . Closed fracture of left lateral malleolus 05/10/2018  . Ankle syndesmosis disruption, left, initial encounter 05/10/2018  . Palpitations 04/28/2017   Past Medical History:  Diagnosis Date  . Anxiety   . Asthma   . Esophageal reflux   . High cholesterol   . History of hiatal hernia   . Hyperlipidemia   . Hypertension   . Hypogonadism male     Family History  Problem Relation Age of Onset  . Cancer Father 96  . Heart attack Father   . Emphysema Father   . Clotting disorder Father   . Hypertension Father   . Cancer Mother 33  . Hypertension Mother   . Anxiety disorder Sister 105    Past Surgical History:  Procedure Laterality Date  . APPENDECTOMY    . LASIK Bilateral 1999  . ORIF ANKLE FRACTURE Left 05/10/2018   Procedure: OPEN REDUCTION INTERNAL FIXATION (ORIF) LEFT BIMALLEOLAR ANKLE AND SYNDESMOSIS;   Surgeon: Tarry Kos, MD;  Location: [facility];  Service: Orthopedics;  Laterality: Left;   Social History   Occupational History  . 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  Tobacco Use  . Smoking status: Never Smoker  . Smokeless tobacco: Never Used  Substance and Sexual Activity  . Alcohol use: Yes  . Drug use: No  . Sexual activity: Not on file

## 2018-10-07 DIAGNOSIS — M25672 Stiffness of left ankle, not elsewhere classified: Secondary | ICD-10-CM | POA: Diagnosis not present

## 2018-10-07 DIAGNOSIS — M25572 Pain in left ankle and joints of left foot: Secondary | ICD-10-CM | POA: Diagnosis not present

## 2018-10-07 DIAGNOSIS — M25472 Effusion, left ankle: Secondary | ICD-10-CM | POA: Diagnosis not present

## 2018-10-12 DIAGNOSIS — M25672 Stiffness of left ankle, not elsewhere classified: Secondary | ICD-10-CM | POA: Diagnosis not present

## 2018-10-12 DIAGNOSIS — M25572 Pain in left ankle and joints of left foot: Secondary | ICD-10-CM | POA: Diagnosis not present

## 2018-10-12 DIAGNOSIS — M25472 Effusion, left ankle: Secondary | ICD-10-CM | POA: Diagnosis not present

## 2018-10-14 DIAGNOSIS — M25572 Pain in left ankle and joints of left foot: Secondary | ICD-10-CM | POA: Diagnosis not present

## 2018-10-14 DIAGNOSIS — M25672 Stiffness of left ankle, not elsewhere classified: Secondary | ICD-10-CM | POA: Diagnosis not present

## 2018-10-14 DIAGNOSIS — M25472 Effusion, left ankle: Secondary | ICD-10-CM | POA: Diagnosis not present

## 2020-06-09 IMAGING — DX DG ANKLE COMPLETE 3+V*L*
3 series · 3 of 3 positions shown · non-contrast
Comparison: None.

CLINICAL DATA: Pain after fall

EXAM:
LEFT ANKLE COMPLETE - 3+ VIEW

[ankle ap]
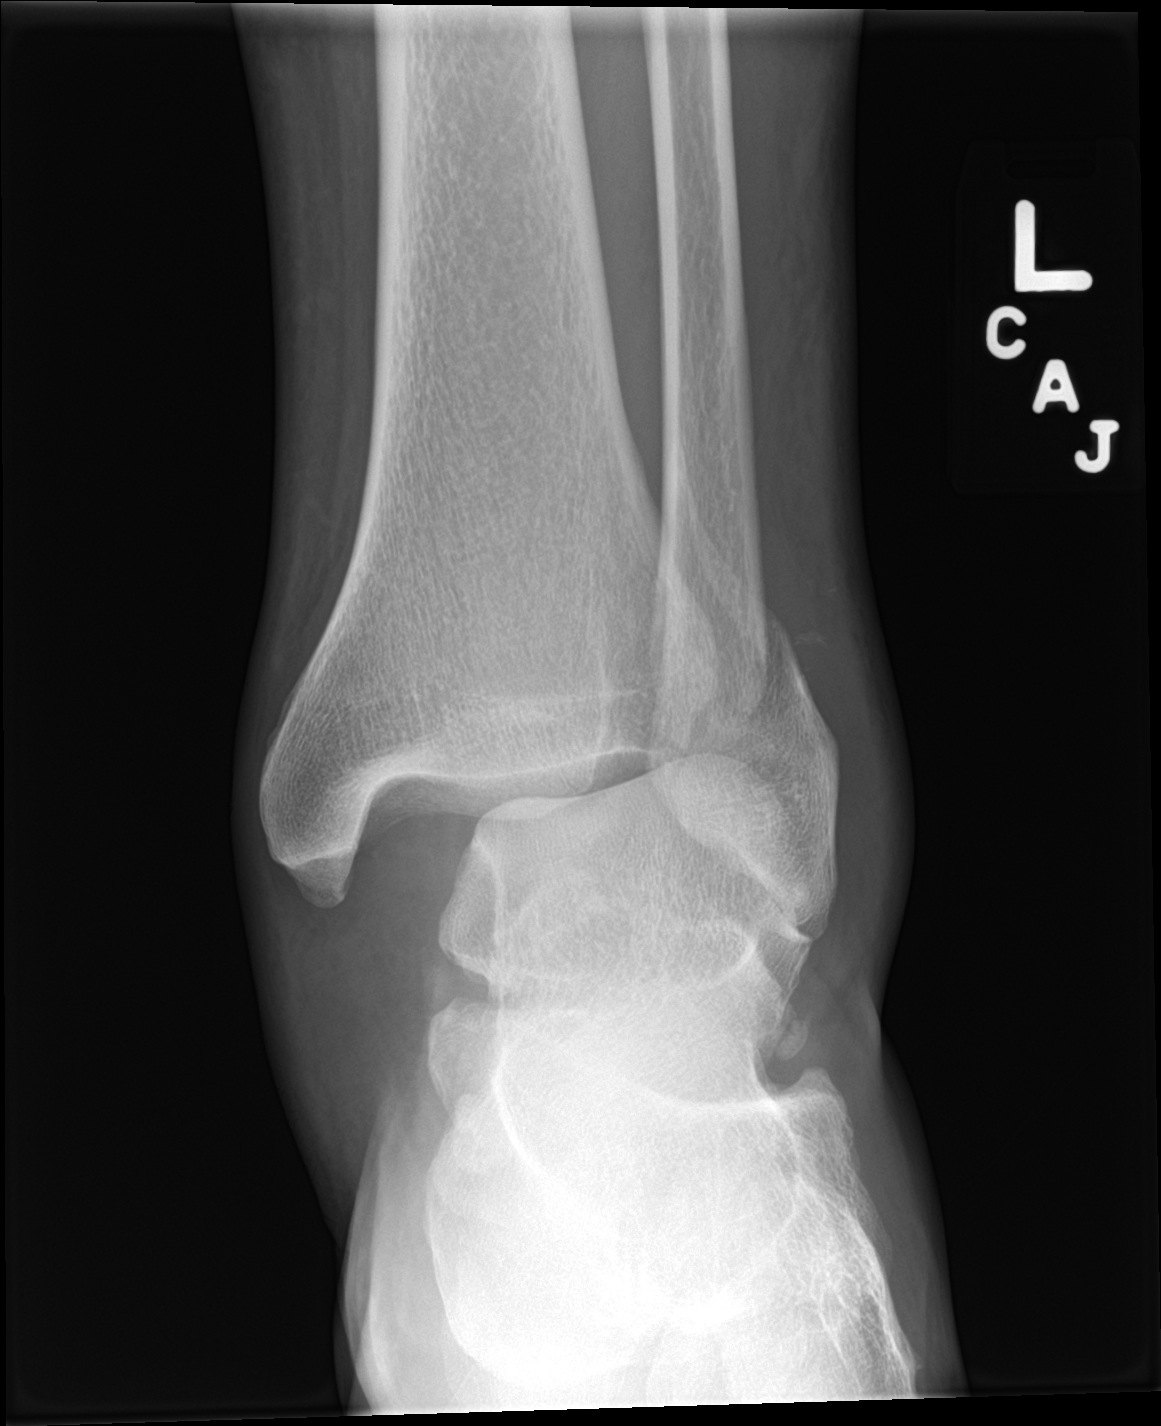

[ankle obl]
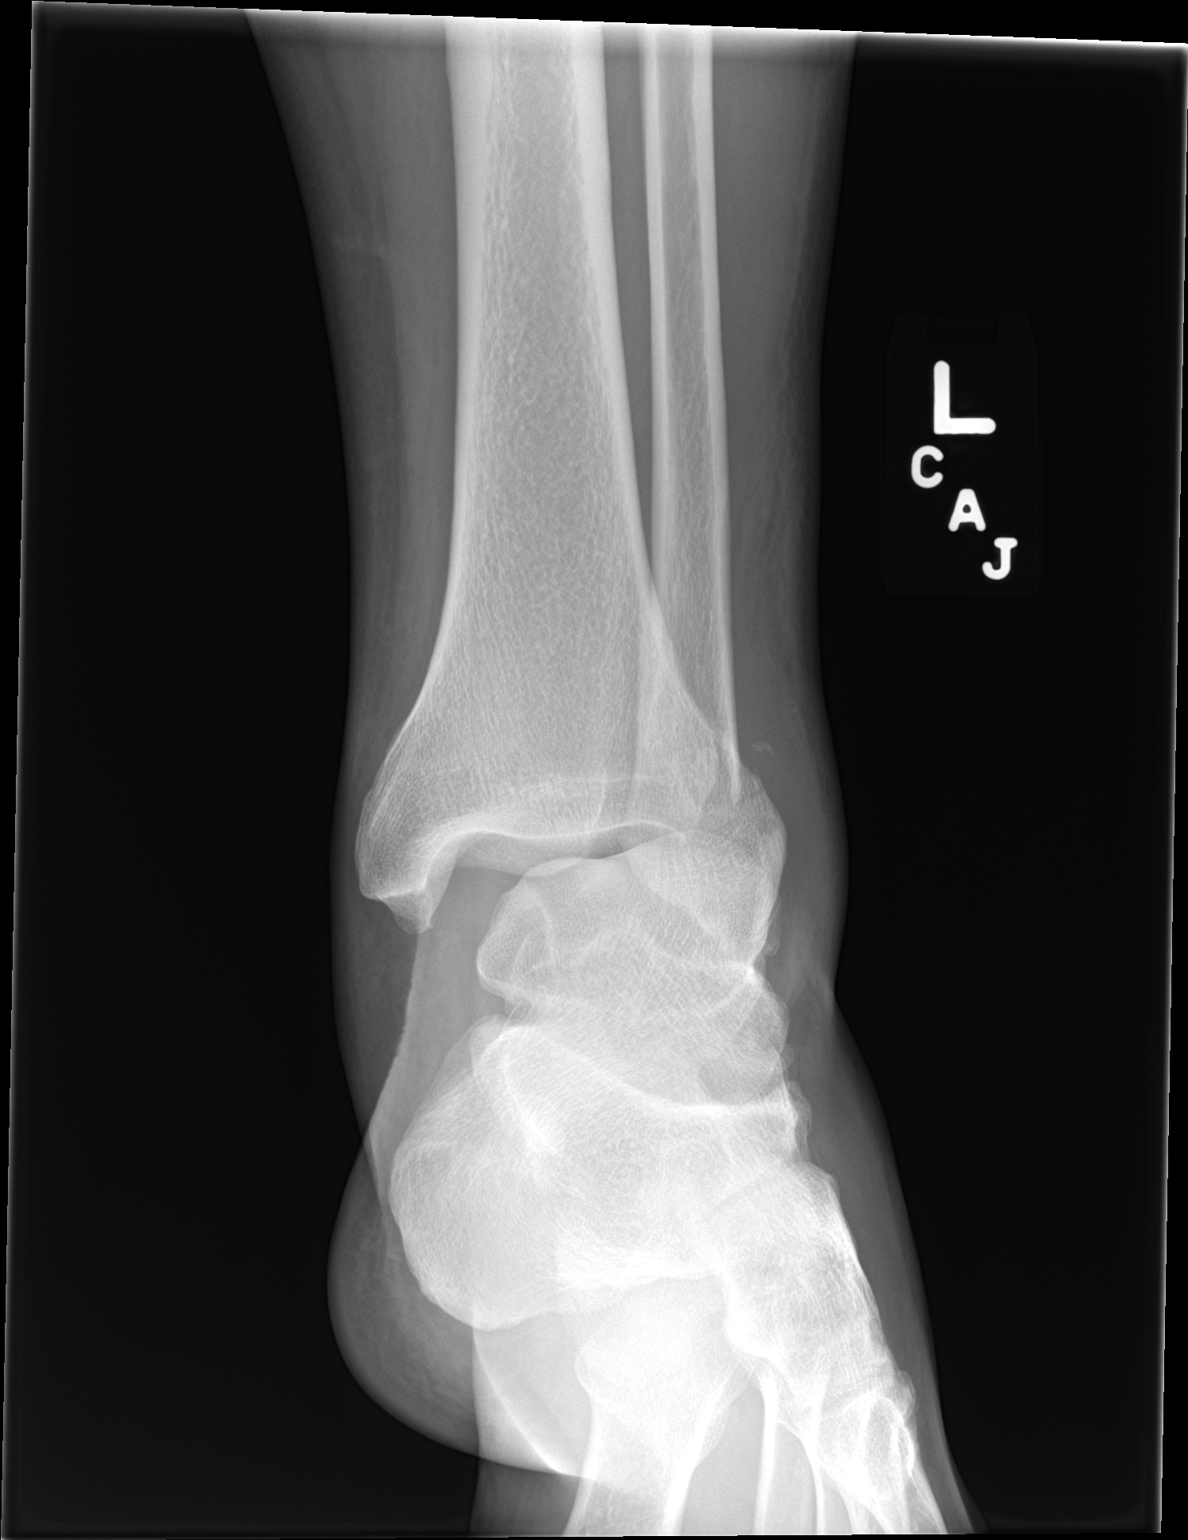

[ankle lat]
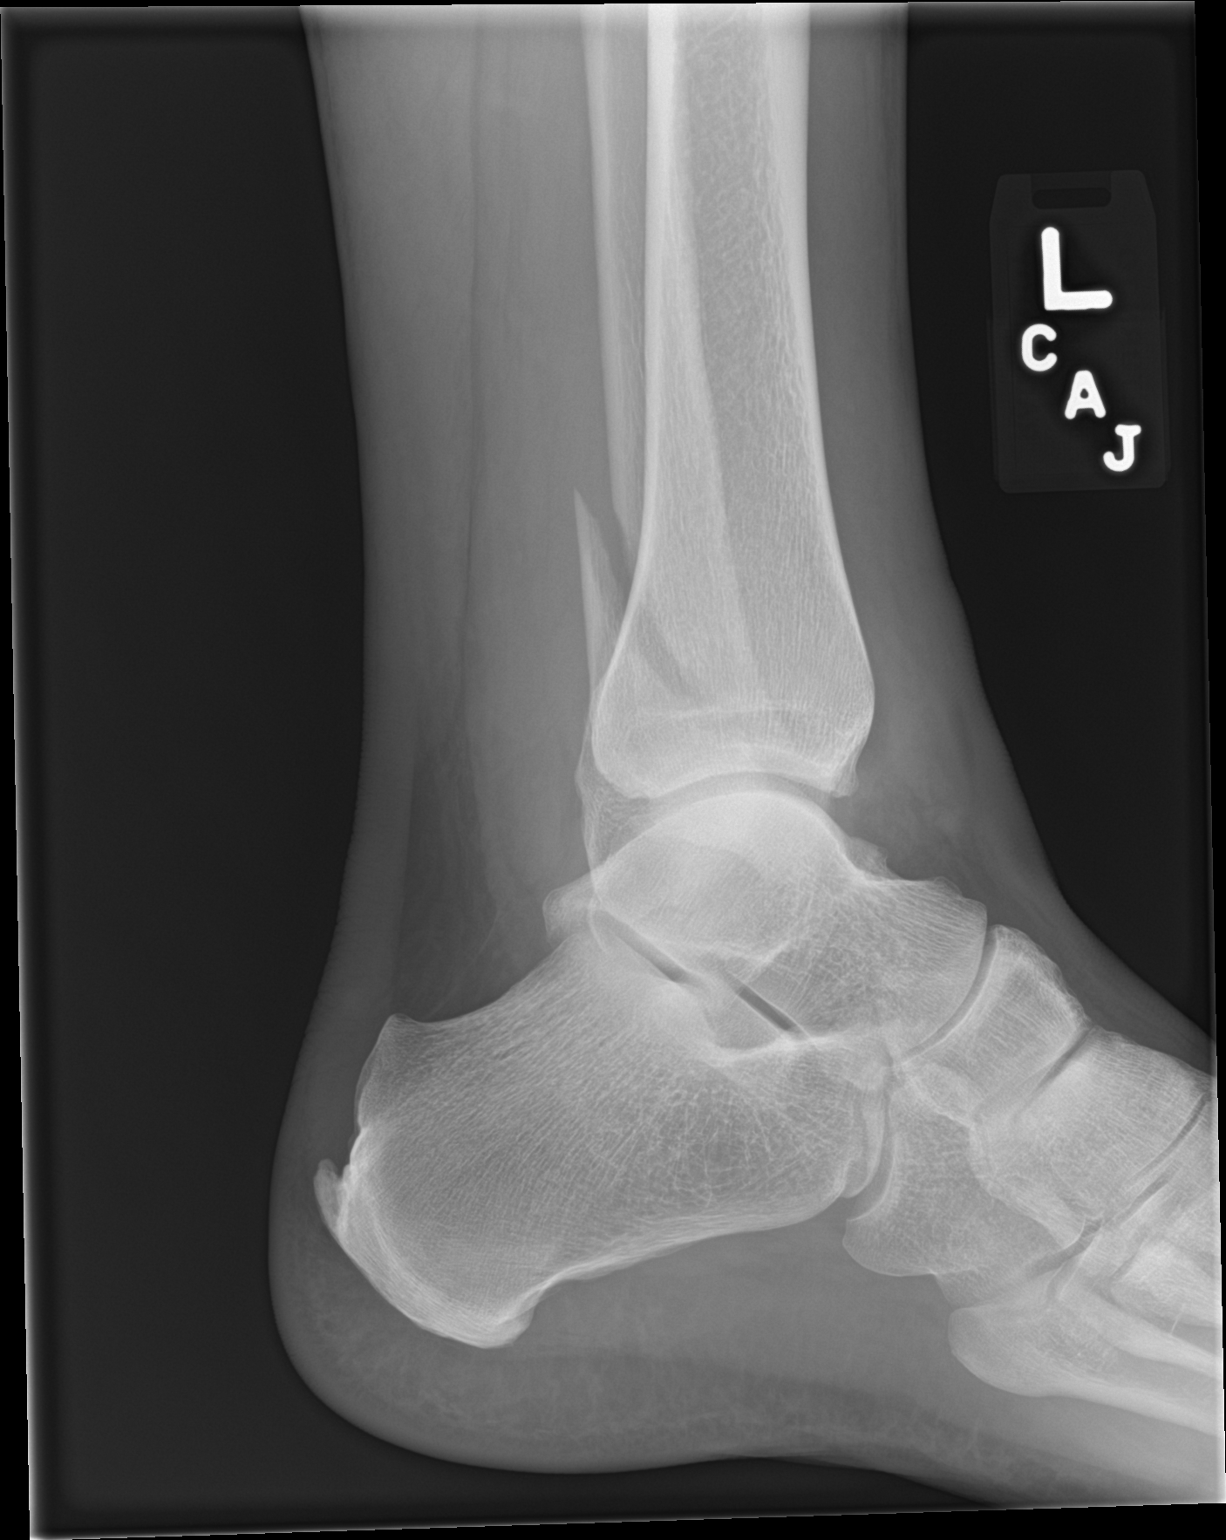

[3 of 3 positions shown; findings below may reference images not displayed]

FINDINGS: There is a fracture through the distal fibula. The distal tibia is
grossly intact. There is disruption of the ankle mortise with
widening medially.
IMPRESSION: Fracture of the distal fibula with disruption of the ankle mortise
and widening of the mortise medially. Joint effusion. No identified
tibial fracture.

## 2021-10-17 ENCOUNTER — Ambulatory Visit: Payer: Commercial Managed Care - PPO | Admitting: Cardiology

## 2021-10-17 ENCOUNTER — Encounter: Payer: Self-pay | Admitting: Cardiology

## 2021-10-17 VITALS — BP 135/84 | HR 71 | Temp 98.0°F | Resp 16 | Ht 73.0 in | Wt 238.0 lb

## 2021-10-17 DIAGNOSIS — R931 Abnormal findings on diagnostic imaging of heart and coronary circulation: Secondary | ICD-10-CM | POA: Insufficient documentation

## 2021-10-17 DIAGNOSIS — I10 Essential (primary) hypertension: Secondary | ICD-10-CM

## 2021-10-17 DIAGNOSIS — E782 Mixed hyperlipidemia: Secondary | ICD-10-CM | POA: Insufficient documentation

## 2021-10-17 NOTE — Progress Notes (Signed)
Patient referred by Cari Caraway, MD for screening for CAD  Subjective:   Joshua Phelps, male    DOB: [birth year], 51 y.o.   MRN: [id number]   Chief Complaint  Patient presents with   Coronary Artery Disease   New Patient (Initial Visit)    HPI  51 y.o. Caucasian male with hypertension, hyperlipidemia, elevated coronary calcium score, family h/o CAD.  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 He tries to walk with his wife, denies chest pain, shortness of breath, palpitations, leg edema, orthopnea, PND, TIA/syncope. Four years ago, he had chest pain. CCTA showed no obstructive CAD< did show calcium score in 91ast percentile. Chest pain was thought to be due to large hiatal hernia. He has not had any recurrent chest pain or GERD symptoms since having been on PPI.   Patient cooks at home, tires to healthy. He admits to having a "very bad sweet tooth". Recent A1C was 6.7%.    Past Medical History:  Diagnosis Date   Anxiety    Asthma    Esophageal reflux    High cholesterol    History of hiatal hernia    Hyperlipidemia    Hypertension    Hypogonadism male      Past Surgical History:  Procedure Laterality Date   APPENDECTOMY     LASIK Bilateral 1999   ORIF ANKLE FRACTURE Left 05/10/2018   Procedure: OPEN REDUCTION INTERNAL FIXATION (ORIF) LEFT BIMALLEOLAR ANKLE AND SYNDESMOSIS;  Surgeon: Leandrew Koyanagi, MD;  Location: [facility];  Service: Orthopedics;  Laterality: Left;     Social History   Tobacco Use  Smoking Status Never  Smokeless Tobacco Never    Social History   Substance and Sexual Activity  Alcohol Use Yes     Family History  Problem Relation Age of Onset   Cancer Father 94   Heart attack Father    Emphysema Father    Clotting disorder Father    Hypertension Father    Cancer Mother 49   Hypertension Mother    Anxiety disorder Sister 34      Current Outpatient Medications:    albuterol (PROVENTIL HFA;VENTOLIN HFA)  108 (90 Base) MCG/ACT inhaler, Inhale 1-2 puffs into the lungs every 6 (six) hours as needed for wheezing or shortness of breath., Disp: , Rfl:    ASMANEX 30 METERED DOSES 220 MCG/INH inhaler, Inhale 1 puff into the lungs every evening., Disp: , Rfl: 3   aspirin EC 81 MG tablet, Take 1 tablet (81 mg total) by mouth 2 (two) times daily., Disp: 28 tablet, Rfl: 0   calcium-vitamin D (OSCAL WITH D) 500-200 MG-UNIT tablet, Take 1 tablet by mouth 3 (three) times daily., Disp: 90 tablet, Rfl: 3   colchicine 0.6 MG tablet, Take 1 tablet (0.6 mg total) by mouth 2 (two) times daily as needed., Disp: 10 tablet, Rfl: 2   ibuprofen (ADVIL,MOTRIN) 800 MG tablet, Take 1 tablet (800 mg total) by mouth 3 (three) times daily., Disp: 21 tablet, Rfl: 0   lisinopril-hydrochlorothiazide (PRINZIDE,ZESTORETIC) 20-12.5 MG tablet, Take 1 tablet by mouth daily., Disp: , Rfl: 1   methocarbamol (ROBAXIN) 500 MG tablet, Take 1 tablet (500 mg total) by mouth every 6 (six) hours as needed for muscle spasms., Disp: 30 tablet, Rfl: 2   montelukast (SINGULAIR) 10 MG tablet, Take 10 mg by mouth every evening., Disp: , Rfl: 2   Multiple Vitamins-Minerals (MULTIVITAMIN ADULT PO), Take 1 tablet by mouth daily., Disp: , Rfl:  omeprazole (PRILOSEC) 40 MG capsule, Take 40 mg by mouth daily., Disp: , Rfl: 3   ondansetron (ZOFRAN) 4 MG tablet, Take 1-2 tablets (4-8 mg total) by mouth every 8 (eight) hours as needed for nausea or vomiting., Disp: 40 tablet, Rfl: 0   oxyCODONE (OXY IR/ROXICODONE) 5 MG immediate release tablet, Take 1-3 tablets (5-15 mg total) by mouth 3 (three) times daily as needed., Disp: 30 tablet, Rfl: 0   predniSONE (STERAPRED UNI-PAK 21 TAB) 10 MG (21) TBPK tablet, Take as directed, Disp: 21 tablet, Rfl: 0   promethazine (PHENERGAN) 25 MG tablet, Take 1 tablet (25 mg total) by mouth every 6 (six) hours as needed for nausea., Disp: 30 tablet, Rfl: 1   rosuvastatin (CRESTOR) 10 MG tablet, Take 10 mg by mouth daily., Disp: ,  Rfl: 3   senna-docusate (SENOKOT S) 8.6-50 MG tablet, Take 1 tablet by mouth at bedtime as needed., Disp: 30 tablet, Rfl: 1   tadalafil (CIALIS) 5 MG tablet, Take 5 mg by mouth daily., Disp: , Rfl:    testosterone cypionate (DEPOTESTOSTERONE CYPIONATE) 200 MG/ML injection, Inject 1 mL into the muscle every 7 (seven) days., Disp: , Rfl: 0   zinc sulfate 220 (50 Zn) MG capsule, Take 1 capsule (220 mg total) by mouth daily., Disp: 42 capsule, Rfl: 0   Cardiovascular and other pertinent studies:  Reviewed external labs and tests, independently interpreted  EKG 10/17/2021: 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 Calcium score 93 which is 65 st percentile for age and sex. Suggest aggressive risk factor modification 2. Non obstructive CAD see detailed report above co-dominant coronary arteries 3.  Normal aortic root 3.4 cm 4. Large hiatal hernia that certainly could be a source of chest pain    Recent labs: 09/12/2021: Glucose 146, BUN/Cr 12/1.3. EGFR 66. HbA1C 6.7% Chol 141, TG 161, HDL 34, LDL 79    Review of Systems  Cardiovascular:  Negative for chest pain, dyspnea on exertion, leg swelling, palpitations and syncope.        Vitals:   10/17/21 1057  BP: 135/84  Pulse: 71  Resp: 16  Temp: 98 F (36.7 C)  SpO2: 100%     Body mass index is 31.4 kg/m. Filed Weights   10/17/21 1057  Weight: 238 lb (108 kg)     Objective:   Physical Exam Vitals and nursing note reviewed.  Constitutional:      General: He is not in acute distress. Neck:     Vascular: No JVD.  Cardiovascular:     Rate and Rhythm: Normal rate and regular rhythm.     Heart sounds: Normal heart sounds. No murmur heard. Pulmonary:     Effort: Pulmonary effort is normal.     Breath sounds: Normal breath sounds. No wheezing or rales.  Musculoskeletal:     Right lower leg: No edema.     Left lower leg: No edema.          Visit diagnoses:   ICD-10-CM   1. Elevated coronary artery calcium  score  R93.1 EKG 12-Lead    2. Essential hypertension  I10     3. Mixed hyperlipidemia  E78.2        Orders Placed This Encounter  Procedures   EKG 12-Lead      Assessment & Recommendations:    51 y.o. Caucasian male with hypertension, hyperlipidemia, elevated coronary calcium score, family h/o CAD.  Elevated coronary calcium score: Continue statin, lipids fairly well controlled. Tolerating Aspirin without  bleeding. Given risk factors, okay to continue. Discussed increasing physical activity, reducing carbs and sugar intake.  I will see him on as needed basis.   Thank you for referring the patient to [place]. Please feel free to contact with any questions.   Nigel Mormon, MD Pager: [phone] Office: [phone]

## 2021-10-17 NOTE — Progress Notes (Deleted)
Patient referred by Cari Caraway, MD for ***  Subjective:   Joshua Phelps, male    DOB: [birth year], 51 y.o.   MRN: [id number]  *** No chief complaint on file.   *** HPI  51 y.o. *** male with ***  *** Past Medical History:  Diagnosis Date   Anxiety    Asthma    Esophageal reflux    High cholesterol    History of hiatal hernia    Hyperlipidemia    Hypertension    Hypogonadism male     *** Past Surgical History:  Procedure Laterality Date   APPENDECTOMY     LASIK Bilateral 1999   ORIF ANKLE FRACTURE Left 05/10/2018   Procedure: OPEN REDUCTION INTERNAL FIXATION (ORIF) LEFT BIMALLEOLAR ANKLE AND SYNDESMOSIS;  Surgeon: Leandrew Koyanagi, MD;  Location: [facility];  Service: Orthopedics;  Laterality: Left;    *** Social History   Tobacco Use  Smoking Status Never  Smokeless Tobacco Never    Social History   Substance and Sexual Activity  Alcohol Use Yes    *** Family History  Problem Relation Age of Onset   Cancer Father 69   Heart attack Father    Emphysema Father    Clotting disorder Father    Hypertension Father    Cancer Mother 56   Hypertension Mother    Anxiety disorder Sister 80    ***  Current Outpatient Medications:    albuterol (PROVENTIL HFA;VENTOLIN HFA) 108 (90 Base) MCG/ACT inhaler, Inhale 1-2 puffs into the lungs every 6 (six) hours as needed for wheezing or shortness of breath., Disp: , Rfl:    ASMANEX 30 METERED DOSES 220 MCG/INH inhaler, Inhale 1 puff into the lungs every evening., Disp: , Rfl: 3   aspirin EC 81 MG tablet, Take 1 tablet (81 mg total) by mouth 2 (two) times daily., Disp: 28 tablet, Rfl: 0   calcium-vitamin D (OSCAL WITH D) 500-200 MG-UNIT tablet, Take 1 tablet by mouth 3 (three) times daily., Disp: 90 tablet, Rfl: 3   colchicine 0.6 MG tablet, Take 1 tablet (0.6 mg total) by mouth 2 (two) times daily as needed., Disp: 10 tablet, Rfl: 2   ibuprofen (ADVIL,MOTRIN) 800 MG tablet, Take 1 tablet (800 mg  total) by mouth 3 (three) times daily., Disp: 21 tablet, Rfl: 0   lisinopril-hydrochlorothiazide (PRINZIDE,ZESTORETIC) 20-12.5 MG tablet, Take 1 tablet by mouth daily., Disp: , Rfl: 1   methocarbamol (ROBAXIN) 500 MG tablet, Take 1 tablet (500 mg total) by mouth every 6 (six) hours as needed for muscle spasms., Disp: 30 tablet, Rfl: 2   montelukast (SINGULAIR) 10 MG tablet, Take 10 mg by mouth every evening., Disp: , Rfl: 2   Multiple Vitamins-Minerals (MULTIVITAMIN ADULT PO), Take 1 tablet by mouth daily., Disp: , Rfl:    omeprazole (PRILOSEC) 40 MG capsule, Take 40 mg by mouth daily., Disp: , Rfl: 3   ondansetron (ZOFRAN) 4 MG tablet, Take 1-2 tablets (4-8 mg total) by mouth every 8 (eight) hours as needed for nausea or vomiting., Disp: 40 tablet, Rfl: 0   oxyCODONE (OXY IR/ROXICODONE) 5 MG immediate release tablet, Take 1-3 tablets (5-15 mg total) by mouth 3 (three) times daily as needed., Disp: 30 tablet, Rfl: 0   predniSONE (STERAPRED UNI-PAK 21 TAB) 10 MG (21) TBPK tablet, Take as directed, Disp: 21 tablet, Rfl: 0   promethazine (PHENERGAN) 25 MG tablet, Take 1 tablet (25 mg total) by mouth every 6 (six) hours as needed for  nausea., Disp: 30 tablet, Rfl: 1   rosuvastatin (CRESTOR) 10 MG tablet, Take 10 mg by mouth daily., Disp: , Rfl: 3   senna-docusate (SENOKOT S) 8.6-50 MG tablet, Take 1 tablet by mouth at bedtime as needed., Disp: 30 tablet, Rfl: 1   tadalafil (CIALIS) 5 MG tablet, Take 5 mg by mouth daily., Disp: , Rfl:    testosterone cypionate (DEPOTESTOSTERONE CYPIONATE) 200 MG/ML injection, Inject 1 mL into the muscle every 7 (seven) days., Disp: , Rfl: 0   zinc sulfate 220 (50 Zn) MG capsule, Take 1 capsule (220 mg total) by mouth daily., Disp: 42 capsule, Rfl: 0   Cardiovascular and other pertinent studies:  Reviewed external labs and tests, independently interpreted  *** EKG ***/***/202***: ***  ***  *** Recent labs: 03/21/2021-09/12/2021: Glucose 121, BUN/Cr 12/1.30.  EGFR 66. Na/K 138/3.9. Rest of the CMP normal H/H ***/***. MCV ***. Platelets *** ***HbA1C 6.7% Chol 141, TG 161, HDL 34, LDL 79 ***TSH ***normal   *** ROS      *** There were no vitals filed for this visit.   There is no height or weight on file to calculate BMI. There were no vitals filed for this visit.  *** Objective:   Physical Exam    ***     Visit diagnoses: No diagnosis found.   No orders of the defined types were placed in this encounter.    Medication changes this visit: There are no discontinued medications.  No orders of the defined types were placed in this encounter.    Assessment & Recommendations:   ***  ***  Thank you for referring the patient to [place]. Please feel free to contact with any questions.   Nigel Mormon, MD Pager: [phone] Office: [phone]

## 2022-04-11 ENCOUNTER — Encounter (HOSPITAL_COMMUNITY): Payer: Self-pay | Admitting: *Deleted

## 2022-04-11 ENCOUNTER — Ambulatory Visit (HOSPITAL_COMMUNITY)
Admission: EM | Admit: 2022-04-11 | Discharge: 2022-04-11 | Disposition: A | Discharge status: Home / Self Care | Payer: Commercial Managed Care - PPO | Attending: Family Medicine | Admitting: Family Medicine

## 2022-04-11 DIAGNOSIS — I10 Essential (primary) hypertension: Secondary | ICD-10-CM | POA: Diagnosis not present

## 2022-04-11 MED ORDER — AMLODIPINE BESYLATE 5 MG PO TABS: 5.0000 mg | ORAL_TABLET | Freq: Every day | ORAL | 0 refills | Status: AC

## 2022-04-11 NOTE — Discharge Instructions (Addendum)
Continue checking your blood pressure 1-2 times a day.  If the blood pressure remains over 140/90, then start taking the amlodipine 5 mg--1 daily  Continue taking lisinopril HCT.  Please follow-up with your primary care in the next week.  They may want to do lab evaluation or other adjustment

## 2022-04-11 NOTE — ED Provider Notes (Signed)
[facility]    CSN: [id number] Arrival date & time: 04/11/22  1636      History   Chief Complaint Chief Complaint  Patient presents with   Hypertension    HPI Joshua Phelps is a 51 y.o. male.    Hypertension   Here for elevated blood pressure.  A few days ago he had not felt great and noticed some floaters in front of his eyes.  He felt foggy also.  He had gone to hockey game and eaten some extra salty foods and dropped a couple of beers at night.  He started checking his blood pressure at home and it was elevated to 185/109.  He has not really had any headache but he has had a little chest tightness, but he states the chest tightness happens all the time.  By the time he got here his blood pressure was 167/100.  He is already taking lisinopril HCT faithfully and has not missed any doses.  Past Medical History:  Diagnosis Date   Anxiety    Asthma    Esophageal reflux    High cholesterol    History of hiatal hernia    Hyperlipidemia    Hypertension    Hypogonadism male     Patient Active Problem List   Diagnosis Date Noted   Elevated coronary artery calcium score 10/17/2021   Essential hypertension 10/17/2021   Mixed hyperlipidemia 10/17/2021   Left foot pain 09/23/2018   Closed fracture of left lateral malleolus 05/10/2018   Ankle syndesmosis disruption, left, initial encounter 05/10/2018   Palpitations 04/28/2017    Past Surgical History:  Procedure Laterality Date   APPENDECTOMY     LASIK Bilateral 1999   ORIF ANKLE FRACTURE Left 05/10/2018   Procedure: OPEN REDUCTION INTERNAL FIXATION (ORIF) LEFT BIMALLEOLAR ANKLE AND SYNDESMOSIS;  Surgeon: Tarry Kos, MD;  Location: [facility];  Service: Orthopedics;  Laterality: Left;       Home Medications    Prior to Admission medications   Medication Sig Start Date End Date Taking? Authorizing Provider  albuterol (PROVENTIL HFA;VENTOLIN HFA) 108 (90 Base) MCG/ACT inhaler Inhale  1-2 puffs into the lungs every 6 (six) hours as needed for wheezing or shortness of breath.   Yes [provider]  amLODipine (NORVASC) 5 MG tablet Take 1 tablet (5 mg total) by mouth daily. 04/11/22  Yes Littleton Haub, Janace Aris, MD  ASMANEX 30 METERED DOSES 220 MCG/INH inhaler Inhale 1 puff into the lungs every evening. 11/12/15  Yes [provider]  aspirin EC 81 MG tablet Take 1 tablet (81 mg total) by mouth 2 (two) times daily. 05/10/18  Yes Tarry Kos, MD  calcium-vitamin D (OSCAL WITH D) 500-200 MG-UNIT tablet Take 1 tablet by mouth 3 (three) times daily. 05/10/18  Yes Tarry Kos, MD  lisinopril-hydrochlorothiazide (PRINZIDE,ZESTORETIC) 20-12.5 MG tablet Take 1 tablet by mouth daily. 11/09/15  Yes [provider]  montelukast (SINGULAIR) 10 MG tablet Take 10 mg by mouth every evening. 11/09/15  Yes [provider]  Multiple Vitamins-Minerals (MULTIVITAMIN ADULT PO) Take 1 tablet by mouth daily.   Yes [provider]  omeprazole (PRILOSEC) 40 MG capsule Take 40 mg by mouth daily. 11/09/15  Yes [provider]  rosuvastatin (CRESTOR) 10 MG tablet Take 10 mg by mouth daily. 04/06/17  Yes [provider]  tadalafil (CIALIS) 5 MG tablet Take 5 mg by mouth daily.   Yes [provider]  testosterone cypionate (DEPOTESTOSTERONE CYPIONATE) 200  MG/ML injection Inject 1 mL into the muscle every 7 (seven) days. 09/05/15  Yes [provider]    Family History Family History  Problem Relation Age of Onset   Heart attack Mother    Cancer Mother 48   Hypertension Mother    Cancer Father 56   Heart attack Father    Emphysema Father    Clotting disorder Father    Hypertension Father    Anxiety disorder Sister 2    Social History Social History   Tobacco Use   Smoking status: Never   Smokeless tobacco: Never  Vaping Use   Vaping Use: Never used  Substance Use Topics   Alcohol use: Yes    Comment: occ   Drug use: No      Allergies   Patient has no known allergies.   Review of Systems Review of Systems   Physical Exam Triage Vital Signs ED Triage Vitals  Enc Vitals Group     BP 04/11/22 1719 (!) 167/100     Pulse Rate 04/11/22 1719 86     Resp 04/11/22 1719 18     Temp 04/11/22 1719 98.2 F (36.8 C)     Temp Source 04/11/22 1719 Oral     SpO2 04/11/22 1719 95 %     Weight --      Height --      Head Circumference --      Peak Flow --      Pain Score 04/11/22 1714 0     Pain Loc --      Pain Edu? --      Excl. in GC? --    No data found.  Updated Vital Signs BP (!) 167/100 (BP Location: Right Arm)   Pulse 86   Temp 98.2 F (36.8 C) (Oral)   Resp 18   SpO2 95%   Visual Acuity Right Eye Distance:   Left Eye Distance:   Bilateral Distance:    Right Eye Near:   Left Eye Near:    Bilateral Near:     Physical Exam Vitals reviewed.  Constitutional:      General: He is not in acute distress.    Appearance: He is not ill-appearing, toxic-appearing or diaphoretic.  HENT:     Mouth/Throat:     Mouth: Mucous membranes are moist.     Pharynx: No oropharyngeal exudate or posterior oropharyngeal erythema.  Eyes:     Extraocular Movements: Extraocular movements intact.     Conjunctiva/sclera: Conjunctivae normal.     Pupils: Pupils are equal, round, and reactive to light.  Cardiovascular:     Rate and Rhythm: Normal rate and regular rhythm.  Musculoskeletal:     Cervical back: No rigidity or tenderness.  Lymphadenopathy:     Cervical: No cervical adenopathy.  Skin:    Coloration: Skin is not jaundiced or pale.  Neurological:     General: No focal deficit present.     Mental Status: He is alert and oriented to person, place, and time.      UC Treatments / Results  Labs (all labs ordered are listed, but only abnormal results are displayed) Labs Reviewed - No data to display  EKG   Radiology No results found.  Procedures Procedures (including critical care  time)  Medications Ordered in UC Medications - No data to display  Initial Impression / Assessment and Plan / UC Course  I have reviewed the triage vital signs and the nursing notes.  Pertinent labs &  imaging results that were available during my care of the patient were reviewed by me and considered in my medical decision making (see chart for details).        Going to send in amlodipine.  He will check his blood pressure 1-2 times daily, and if it remains over normal, then he will begin taking the amlodipine and continue his lisinopril HCT.  Follow-up with his primary care Final Clinical Impressions(s) / UC Diagnoses   Final diagnoses:  Essential hypertension, benign     Discharge Instructions      Continue checking your blood pressure 1-2 times a day.  If the blood pressure remains over 140/90, then start taking the amlodipine 5 mg--1 daily  Continue taking lisinopril HCT.  Please follow-up with your primary care in the next week.  They may want to do lab evaluation or other adjustment     ED Prescriptions     Medication Sig Dispense Auth. Provider   amLODipine (NORVASC) 5 MG tablet Take 1 tablet (5 mg total) by mouth daily. 30 tablet Leba Tibbitts, Janace Aris, MD      PDMP not reviewed this encounter.   Zenia Resides, MD 04/11/22 1754

## 2022-04-11 NOTE — ED Triage Notes (Addendum)
He is treated for high BP and takes his meds everyday. The last couple days he has been seeing floaters and having some fogginess. He went to hockey game Tuesday and did drink some but he felt bad before that day. He has been taking his BP at home it has been elevated 185/109. He did started some supplements like fish oil and cinnamon.   He has had some nausea in the morning over the last couple days.    He does have some anxity and is tearful in the room about just getting married and leaving his wife.

## 2023-03-07 ENCOUNTER — Other Ambulatory Visit: Payer: Self-pay | Admitting: Urology
